# Patient Record
Sex: Male | Born: 1990 | Race: White | Hispanic: No | Marital: Married | Smoking: Never smoker
Health system: Southern US, Community
[De-identification: ages and names within clinical notes are randomized; demographics above are authoritative.]

## PROBLEM LIST (undated history)

## (undated) HISTORY — PX: NO PAST SURGERIES: SHX2092

---
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
## 2009-05-18 IMAGING — CT CT NECK W/ CM
2 of 3 series · 13 of 20 positions shown, 16 images · IV contrast (agent unspecified)
Comparison: None

CLINICAL DATA: Headache and fever congestion with wisdom teeth
removed on 11/11/2007

CT NECK WITH CONTRAST
TECHNIQUE: Multidetector CT imaging of the neck was performed with
intravenous contrast.
Contrast: 100 ml 1mnipaque-OUU IV

[Series 3: 2cc/(id)/1cc/(id) · axial · 0.42mm/px · z∈[-222,-0]mm · 12 of 71 slices shown, 15 images]
[im 6/71  soft-tissue]
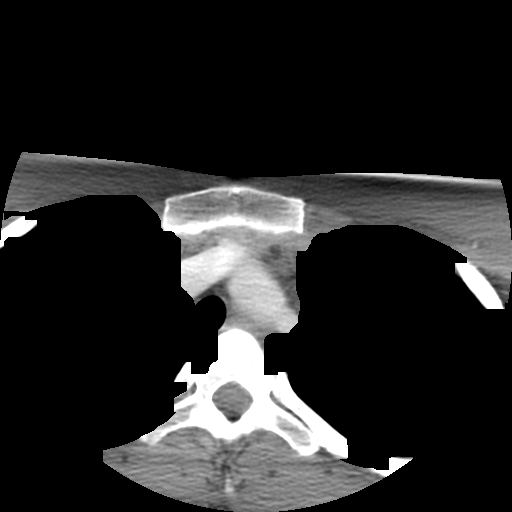
[im 6/71  bone]
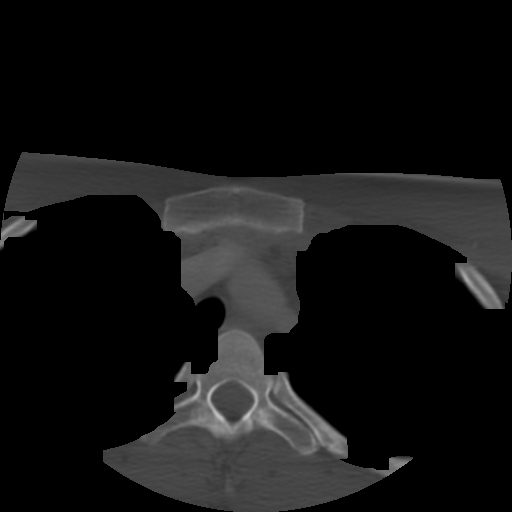
[im 11/71  bone]
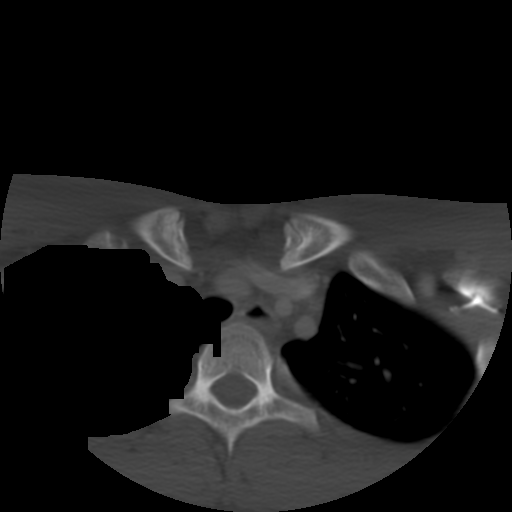
[im 17/71  bone]
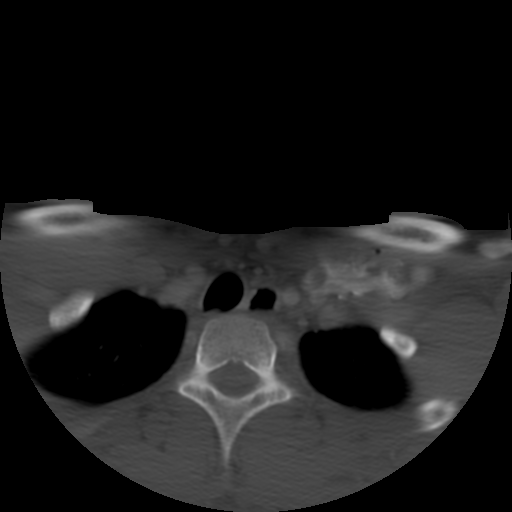
[im 22/71  bone]
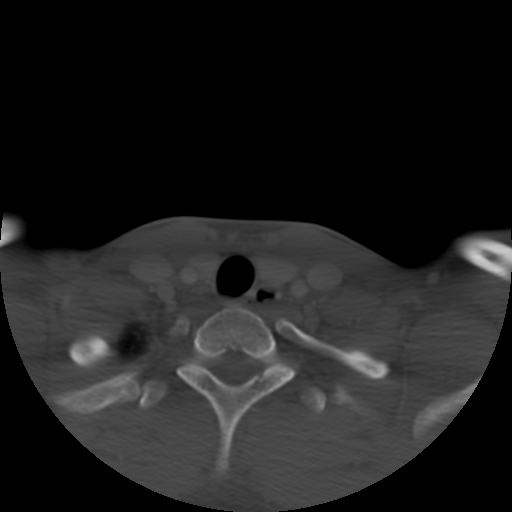
[im 27/71  soft-tissue]
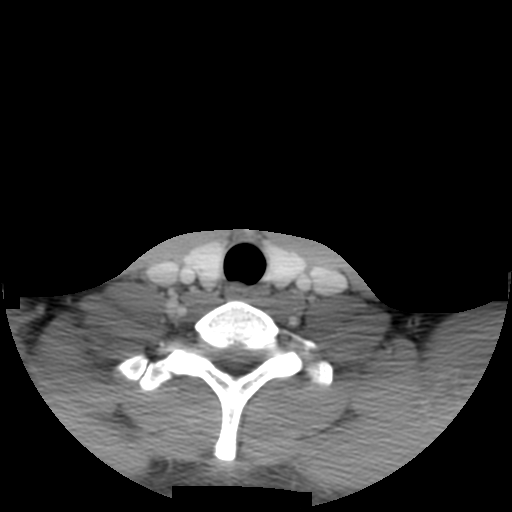
[im 27/71  bone]
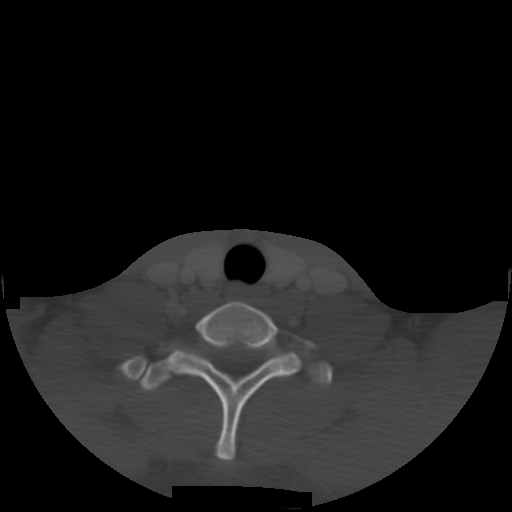
[im 33/71  bone]
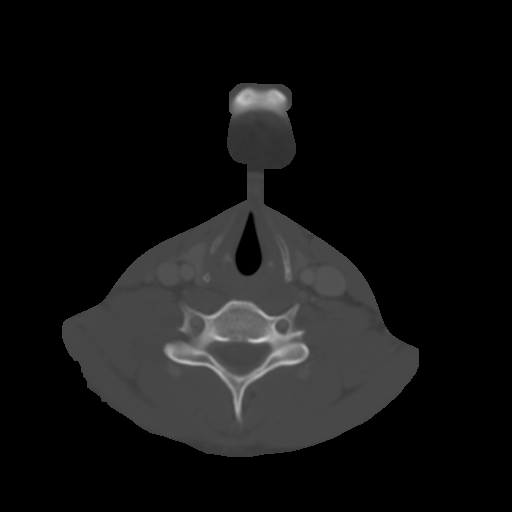
[im 38/71  bone]
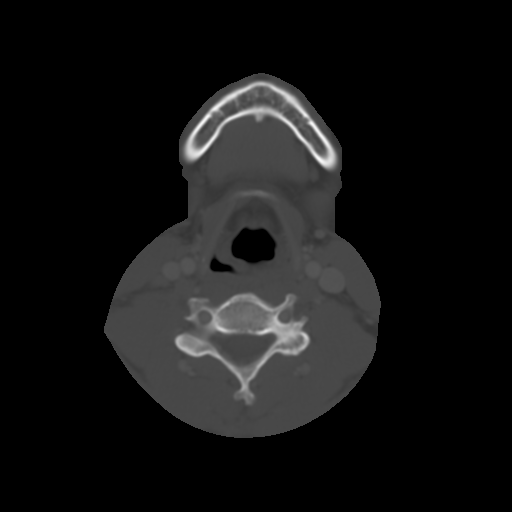
[im 44/71  bone]
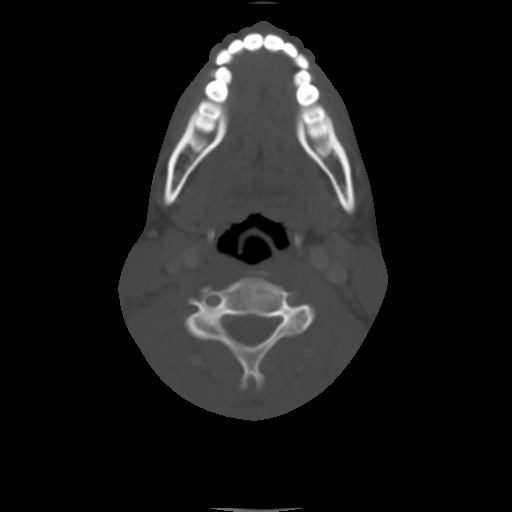
[im 49/71  soft-tissue]
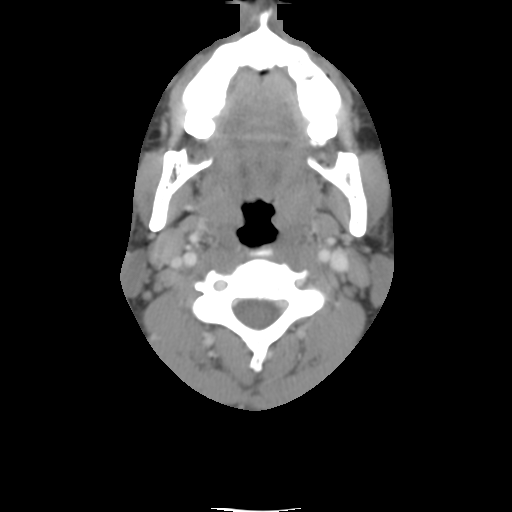
[im 49/71  bone]
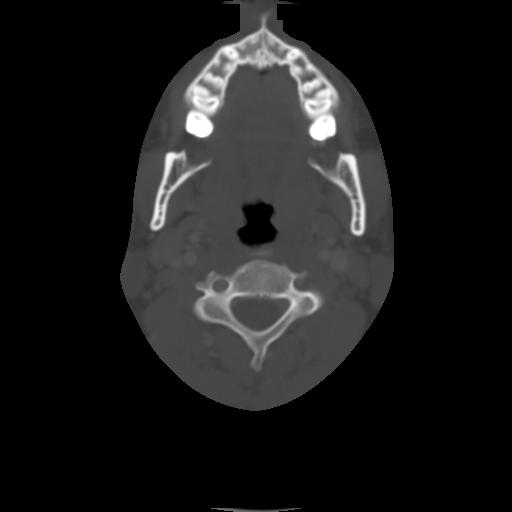
[im 54/71  bone]
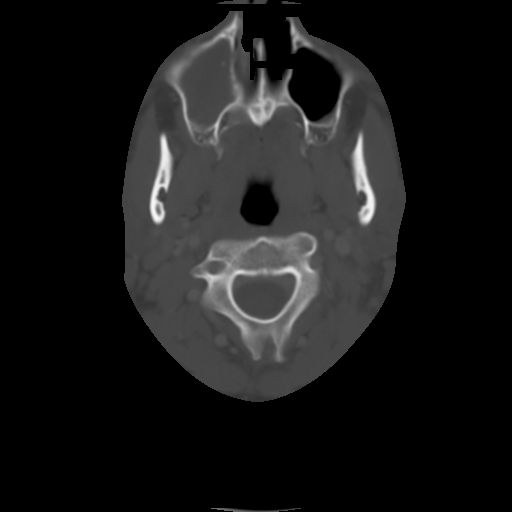
[im 60/71  bone]
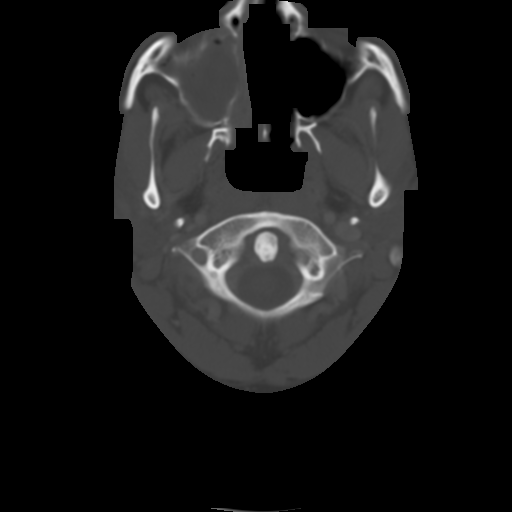
[im 65/71  bone]
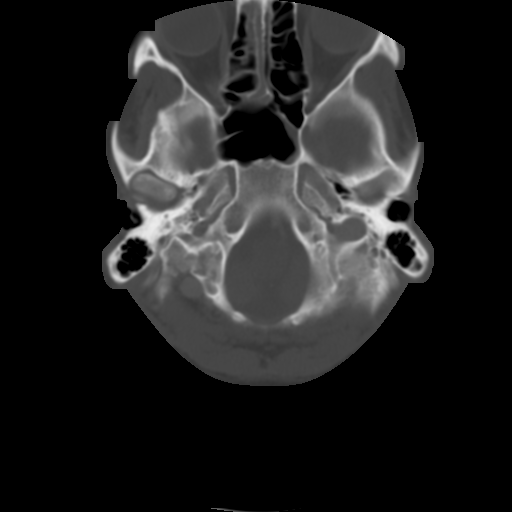

[Series 400: sag · sagittal · 0.53mm/px · 1 of 79 slices shown]
[im 40/79  bone]
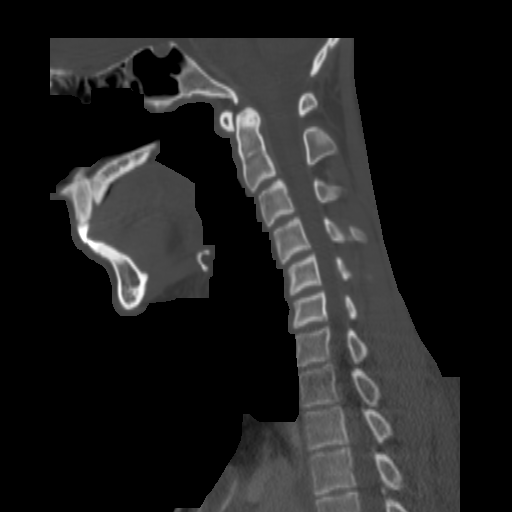

[13 of 20 positions shown; findings below may reference images not displayed]

FINDINGS: There is opacification the right maxillary sinus
containing gas bubbles.  This is probably due to acute sinusitis.
Nasal septum is deviated to the right  and there is mucosal
thickening in the right ethmoid sinus.

There is cervical adenopathy.  There is an enlarged level II node
on the right measuring 12 x 17mm.  There is a 10 x15 mm node on the
left and a 12 mm node on the left.  These nodes are homogeneous and
show mild enhancement.  No other pathologically enlarged nodes are
identified.  The thyroid is normal, the submandibular and parotid
glands are normal bilaterally.  Mild soft tissue thickening involve
lymphoid tissue  in the pharynx.
IMPRESSION: Right-sided sinusitis which may be acute.

Cervical adenopathy is present with enlarged  level II nodes
bilaterally.  These are likely reactive nodes.  Lymphoma is in the
differential and close follow-up to assure clearing of these nodes
is suggested.

## 2011-03-24 LAB — BASIC METABOLIC PANEL
CO2: 26
Chloride: 101
Glucose, Bld: 81
Sodium: 137

## 2011-03-24 LAB — DIFFERENTIAL
Basophils Absolute: 0
Basophils Relative: 0
Eosinophils Absolute: 0.1
Eosinophils Relative: 1
Monocytes Absolute: 1.4 — ABNORMAL HIGH
Monocytes Relative: 13 — ABNORMAL HIGH

## 2011-03-24 LAB — CBC
Hemoglobin: 14.9
MCHC: 34
MCV: 88.1
RBC: 4.97
RDW: 13.1

## 2011-03-24 LAB — CULTURE, BLOOD (ROUTINE X 2): Culture: NO GROWTH

## 2013-10-23 ENCOUNTER — Emergency Department (HOSPITAL_COMMUNITY)
Admission: EM | Admit: 2013-10-23 | Discharge: 2013-10-23 | Disposition: A | Discharge status: Home / Self Care | Payer: 59 | Attending: Emergency Medicine | Admitting: Emergency Medicine

## 2013-10-23 ENCOUNTER — Encounter (HOSPITAL_COMMUNITY): Payer: Self-pay | Admitting: Emergency Medicine

## 2013-10-23 ENCOUNTER — Emergency Department (HOSPITAL_COMMUNITY): Payer: 59

## 2013-10-23 DIAGNOSIS — Z79899 Other long term (current) drug therapy: Secondary | ICD-10-CM | POA: Insufficient documentation

## 2013-10-23 DIAGNOSIS — N453 Epididymo-orchitis: Secondary | ICD-10-CM | POA: Insufficient documentation

## 2013-10-23 DIAGNOSIS — N451 Epididymitis: Secondary | ICD-10-CM

## 2013-10-23 LAB — URINALYSIS, ROUTINE W REFLEX MICROSCOPIC
Bilirubin Urine: NEGATIVE
GLUCOSE, UA: NEGATIVE mg/dL
HGB URINE DIPSTICK: NEGATIVE
Ketones, ur: NEGATIVE mg/dL
LEUKOCYTES UA: NEGATIVE
Nitrite: NEGATIVE
Protein, ur: NEGATIVE mg/dL
SPECIFIC GRAVITY, URINE: 1.029 (ref 1.005–1.030)
UROBILINOGEN UA: 0.2 mg/dL (ref 0.0–1.0)
pH: 6.5 (ref 5.0–8.0)

## 2013-10-23 MED ORDER — DOXYCYCLINE HYCLATE 100 MG PO CAPS: 100.0000 mg | ORAL_CAPSULE | Freq: Two times a day (BID) | ORAL | Status: DC

## 2013-10-23 MED ORDER — NAPROXEN 500 MG PO TABS: 500.0000 mg | ORAL_TABLET | Freq: Two times a day (BID) | ORAL | Status: DC

## 2013-10-23 MED ORDER — IBUPROFEN 800 MG PO TABS
800.0000 mg | ORAL_TABLET | Freq: Once | ORAL | Status: AC
  Administered 2013-10-23: 800 mg via ORAL
  Filled 2013-10-23: qty 1

## 2013-10-23 MED ORDER — CEFTRIAXONE SODIUM 250 MG IJ SOLR
250.0000 mg | Freq: Once | INTRAMUSCULAR | Status: AC
  Administered 2013-10-23: 250 mg via INTRAMUSCULAR
  Filled 2013-10-23: qty 250

## 2013-10-23 MED ORDER — LIDOCAINE HCL (PF) 1 % IJ SOLN
INTRAMUSCULAR | Status: AC
  Administered 2013-10-23: 2 mL
  Filled 2013-10-23: qty 5

## 2013-10-23 NOTE — Discharge Instructions (Signed)
Start taking naprosyn daily for inflammation. Take antibiotic until all gone. Follow up with urology. Return if worsening.     Epididymitis Epididymitis is a swelling (inflammation) of the epididymis. The epididymis is a cord-like structure along the back part of the testicle. Epididymitis is usually, but not always, caused by infection. This is usually a sudden problem beginning with chills, fever and pain behind the scrotum and in the testicle. There may be swelling and redness of the testicle. DIAGNOSIS  Physical examination will reveal a tender, swollen epididymis. Sometimes, cultures are obtained from the urine or from prostate secretions to help find out if there is an infection or if the cause is a different problem. Sometimes, blood work is performed to see if your white blood cell count is elevated and if a germ (bacterial) or viral infection is present. Using this knowledge, an appropriate medicine which kills germs (antibiotic) can be chosen by your caregiver. A viral infection causing epididymitis will most often go away (resolve) without treatment. HOME CARE INSTRUCTIONS   Hot sitz baths for 20 minutes, 4 times per day, may help relieve pain.  Only take over-the-counter or prescription medicines for pain, discomfort or fever as directed by your caregiver.  Take all medicines, including antibiotics, as directed. Take the antibiotics for the full prescribed length of time even if you are feeling better.  It is very important to keep all follow-up appointments. SEEK IMMEDIATE MEDICAL CARE IF:   You have a fever.  You have pain not relieved with medicines.  You have any worsening of your problems.  Your pain seems to come and go.  You develop pain, redness, and swelling in the scrotum and surrounding areas. MAKE SURE YOU:   Understand these instructions.  Will watch your condition.  Will get help right away if you are not doing well or get worse. Document Released:  06/11/2000 Document Revised: 09/06/2011 Document Reviewed: 05/01/2009 [facility] Patient Information 2014 [place], [place].

## 2013-10-23 NOTE — ED Notes (Signed)
Presents with c/o left testicular pain that began yesterday.  Has been treated for epididmitis two weeks ago, same side.  Denies injury.  Admits to some swelling. Exam deferred at this time.

## 2013-10-23 NOTE — ED Notes (Signed)
Pt still in US

## 2013-10-23 NOTE — ED Provider Notes (Signed)
CSN: [id number]     Arrival date & time 10/23/13  0610 History   First MD Initiated Contact with Patient 10/23/13 [phone]     Chief Complaint  Patient presents with  . Testicle Pain     (Consider location/radiation/quality/duration/timing/severity/associated sxs/prior Treatment) HPI Patrick Ford is a 23 y.o. male who presents to emergency department complaining of left testicular pain and swelling. Patient states pain began gradually onset yesterday morning. States worsened this morning. Patient recently was seen by his primary care Dr. for similar symptoms a week ago, did not have any tests done at that time, was diagnosed with epididymitis and started on antibiotics which she finished. Patient states his pain did improve with antibiotics. He denies any recent injury to that area. He denies any pain with urination. No hematuria. No penile discharge. He is sexually active with one partner. He denies any fever, chills, abdominal pain. No changes in his bowels. States was recently checked for hernias and was told he didn't have any. He denies any nausea or vomiting.    History reviewed. No pertinent past medical history. History reviewed. No pertinent past surgical history. History reviewed. No pertinent family history. History  Substance Use Topics  . Smoking status: Never Smoker   . Smokeless tobacco: Not on file  . Alcohol Use: Yes     Comment: socially    Review of Systems  Constitutional: Negative for fever and chills.  Respiratory: Negative for cough, chest tightness and shortness of breath.   Cardiovascular: Negative for chest pain, palpitations and leg swelling.  Gastrointestinal: Negative for nausea, vomiting, abdominal pain, diarrhea and abdominal distention.  Genitourinary: Positive for scrotal swelling and testicular pain. Negative for dysuria, urgency, frequency, hematuria, discharge, penile swelling and penile pain.  Musculoskeletal: Negative for arthralgias, myalgias,  neck pain and neck stiffness.  Skin: Negative for rash.  Allergic/Immunologic: Negative for immunocompromised state.  Neurological: Negative for dizziness, weakness, light-headedness, numbness and headaches.      Allergies  Review of patient's allergies indicates no known allergies.  Home Medications   Prior to Admission medications   Medication Sig Start Date End Date Taking? Authorizing Provider  amphetamine-dextroamphetamine (ADDERALL) 10 MG tablet Take 10 mg by mouth 2 (two) times daily with a meal.   Yes Historical Provider, MD  HYDROcodone-acetaminophen (NORCO/VICODIN) 5-325 MG per tablet Take 1 tablet by mouth every 6 (six) hours as needed for moderate pain.   Yes Historical Provider, MD   BP 130/72  Pulse 65  Temp(Src) 97.9 F (36.6 C) (Oral)  Resp 16  Ht 6\' 5"  (1.956 m)  Wt 185 lb (83.915 kg)  BMI 21.93 kg/m2  SpO2 99% Physical Exam  Nursing note and vitals reviewed. Constitutional: He is oriented to person, place, and time. He appears well-developed and well-nourished. No distress.  HENT:  Head: Normocephalic and atraumatic.  Eyes: Conjunctivae are normal.  Neck: Neck supple.  Cardiovascular: Normal rate, regular rhythm and normal heart sounds.   Pulmonary/Chest: Effort normal. No respiratory distress. He has no wheezes. He has no rales.  Abdominal: Soft. Bowel sounds are normal. He exhibits no distension. There is no tenderness. There is no rebound.  Genitourinary:  Left testicle is swollen, tender to palpation. Cremasteric reflex present bilaterally. Penis is normal, uncircumcised.  Musculoskeletal: He exhibits no edema.  Neurological: He is alert and oriented to person, place, and time.  Skin: Skin is warm and dry.    ED Course  Procedures (including critical care time) Labs Review Labs Reviewed  URINE CULTURE  GC/CHLAMYDIA PROBE AMP  URINALYSIS, ROUTINE W REFLEX MICROSCOPIC    Imaging Review [place] Scrotum  10/23/2013   CLINICAL DATA:  Left scrotal  pain and swelling.  EXAM: SCROTAL ULTRASOUND  DOPPLER ULTRASOUND OF THE TESTICLES  TECHNIQUE: Complete ultrasound examination of the testicles, epididymis, and other scrotal structures was performed. Color and spectral Doppler ultrasound were also utilized to evaluate blood flow to the testicles.  COMPARISON:  None.  FINDINGS: Right testicle  Measurements: 2.6 x 1.7 x 2.0 cm. No mass or microlithiasis visualized.  Left testicle  Measurements: 5.4 x 4.0 x 4.1 cm. No mass or microlithiasis visualized.  Right epididymis:  Normal in size and appearance.  Left epididymis: Enlarged with increased vascularity and an inhomogeneous appearance with an adjacent small complex hydrocele.  Hydrocele:  Small left hydrocele.  Varicocele:  Small left varicocele.  Pulsed Doppler interrogation of both testes demonstrates low resistance arterial and venous waveforms bilaterally.  IMPRESSION: Left epididymitis.  Small left varicocele.   Electronically Signed   By: Geanie CooleyJim  Maxwell M.D.   On: 10/23/2013 08:12   [place] Art/ven Flow Abd Pelv Doppler  10/23/2013   CLINICAL DATA:  Left scrotal pain and swelling.  EXAM: SCROTAL ULTRASOUND  DOPPLER ULTRASOUND OF THE TESTICLES  TECHNIQUE: Complete ultrasound examination of the testicles, epididymis, and other scrotal structures was performed. Color and spectral Doppler ultrasound were also utilized to evaluate blood flow to the testicles.  COMPARISON:  None.  FINDINGS: Right testicle  Measurements: 2.6 x 1.7 x 2.0 cm. No mass or microlithiasis visualized.  Left testicle  Measurements: 5.4 x 4.0 x 4.1 cm. No mass or microlithiasis visualized.  Right epididymis:  Normal in size and appearance.  Left epididymis: Enlarged with increased vascularity and an inhomogeneous appearance with an adjacent small complex hydrocele.  Hydrocele:  Small left hydrocele.  Varicocele:  Small left varicocele.  Pulsed Doppler interrogation of both testes demonstrates low resistance arterial and venous waveforms  bilaterally.  IMPRESSION: Left epididymitis.  Small left varicocele.   Electronically Signed   By: Geanie CooleyJim  Maxwell M.D.   On: 10/23/2013 08:12     EKG Interpretation None      MDM   Final diagnoses:  Epididymitis, left   Patient with gradual onset of left testicular pain and swelling. Recent epididymitis. No urinary symptoms. Will get UA, ultrasound of scrotum.  8:52 AM Ultrasounds positive for left epididymitis. Given 250 mg of Rocephin IM in ED. Ibuprofen 800 mg. Home with doxycycline, followup with urology for recurrent epididymitis. Return if symptoms worsening. GC/ Chlamydia and urine culture sent.  Filed Vitals:   10/23/13 0634  BP: 130/72  Pulse: 65  Temp: 97.9 F (36.6 C)  TempSrc: Oral  Resp: 16  Height: 6\' 5"  (1.956 m)  Weight: 185 lb (83.915 kg)  SpO2: 99%      Lottie Musselatyana A Aviela Blundell, PA-C 10/23/13 [phone]

## 2013-10-23 NOTE — ED Notes (Signed)
Pt to US.

## 2013-10-24 LAB — URINE CULTURE
Colony Count: NO GROWTH
Culture: NO GROWTH

## 2013-10-24 LAB — GC/CHLAMYDIA PROBE AMP
CT PROBE, AMP APTIMA: NEGATIVE
GC PROBE AMP APTIMA: NEGATIVE

## 2013-10-24 NOTE — ED Provider Notes (Signed)
Medical screening examination/treatment/procedure(s) were performed by non-physician practitioner and as supervising physician I was immediately available for consultation/collaboration.   EKG Interpretation None        Majour Frei, MD 10/24/13 0357 

## 2014-11-06 ENCOUNTER — Other Ambulatory Visit: Payer: Self-pay | Admitting: Family Medicine

## 2014-11-06 DIAGNOSIS — G4452 New daily persistent headache (NDPH): Secondary | ICD-10-CM

## 2014-11-07 ENCOUNTER — Ambulatory Visit: Admission: RE | Admit: 2014-11-07 | Payer: 59 | Source: Ambulatory Visit

## 2014-12-09 ENCOUNTER — Other Ambulatory Visit: Payer: Self-pay | Admitting: *Deleted

## 2014-12-09 MED ORDER — AMPHETAMINE-DEXTROAMPHETAMINE 10 MG PO TABS: 10.0000 mg | ORAL_TABLET | Freq: Two times a day (BID) | ORAL | Status: DC

## 2014-12-09 NOTE — Telephone Encounter (Signed)
Refill request for Adderall 10 mg Last filled by MD on- 11/05/2014 #60 x0 Last Appt: 11/05/2014 Next Appt: none Please advise refill?

## 2015-01-08 ENCOUNTER — Other Ambulatory Visit: Payer: Self-pay | Admitting: *Deleted

## 2015-01-08 MED ORDER — AMPHETAMINE-DEXTROAMPHETAMINE 10 MG PO TABS: 10.0000 mg | ORAL_TABLET | Freq: Two times a day (BID) | ORAL | Status: DC

## 2015-02-11 ENCOUNTER — Other Ambulatory Visit: Payer: Self-pay | Admitting: *Deleted

## 2015-02-11 MED ORDER — AMPHETAMINE-DEXTROAMPHETAMINE 10 MG PO TABS: 10.0000 mg | ORAL_TABLET | Freq: Two times a day (BID) | ORAL | Status: DC

## 2015-03-13 ENCOUNTER — Other Ambulatory Visit: Payer: Self-pay | Admitting: *Deleted

## 2015-03-14 MED ORDER — AMPHETAMINE-DEXTROAMPHETAMINE 10 MG PO TABS: 10.0000 mg | ORAL_TABLET | Freq: Two times a day (BID) | ORAL | Status: DC

## 2015-04-24 ENCOUNTER — Other Ambulatory Visit: Payer: Self-pay | Admitting: *Deleted

## 2015-04-25 MED ORDER — AMPHETAMINE-DEXTROAMPHETAMINE 10 MG PO TABS: 10.0000 mg | ORAL_TABLET | Freq: Two times a day (BID) | ORAL | Status: DC

## 2015-04-26 IMAGING — US US SCROTUM
1 series · 14 of 25 positions shown · non-contrast
Comparison: None.

CLINICAL DATA: Left scrotal pain and swelling.

EXAM:
SCROTAL ULTRASOUND
DOPPLER ULTRASOUND OF THE TESTICLES
TECHNIQUE: Complete ultrasound examination of the testicles, epididymis, and
other scrotal structures was performed. Color and spectral Doppler
ultrasound were also utilized to evaluate blood flow to the
testicles.

[Series 1: us scrotum · 0.06mm/px · 14 of 71 slices shown]
[im 1/71]
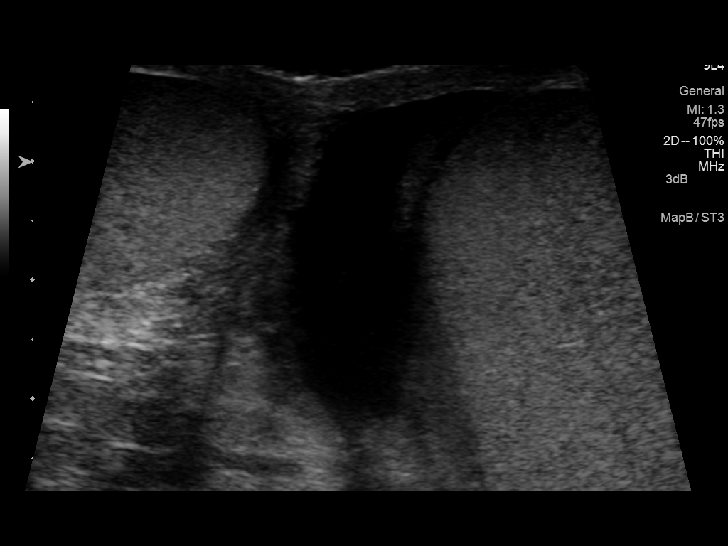
[im 6/71]
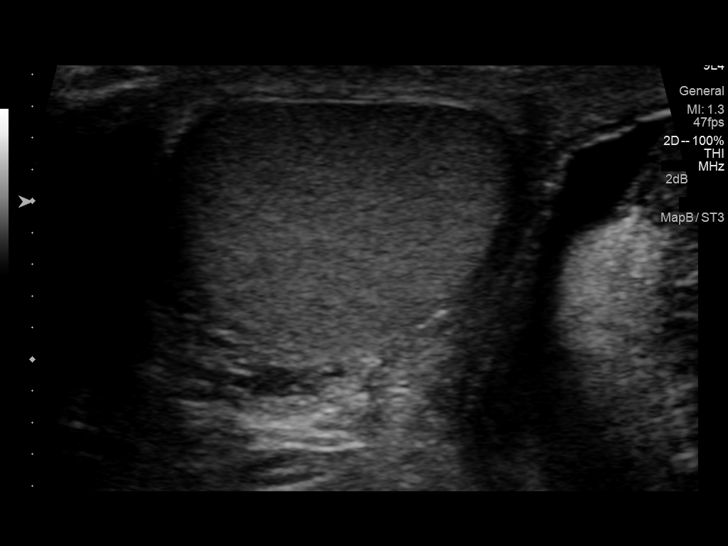
[im 12/71]
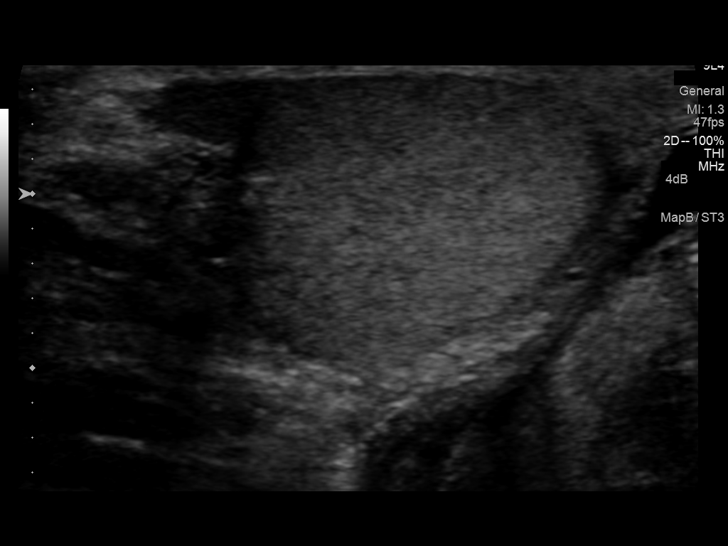
[im 18/71]
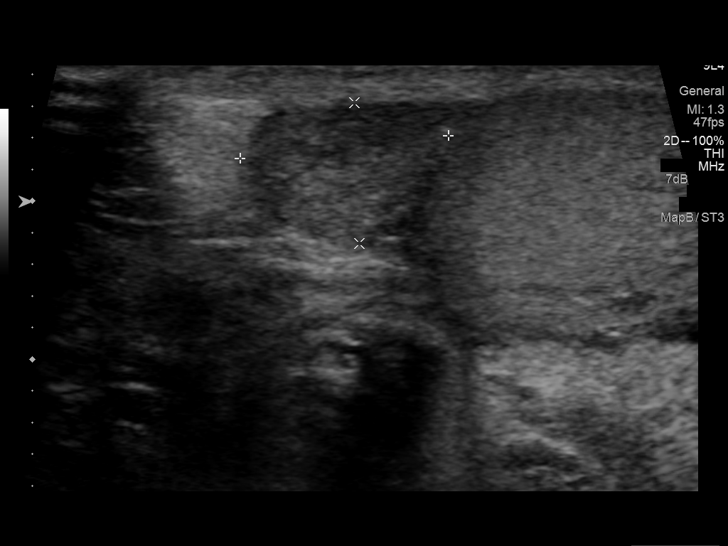
[im 24/71]
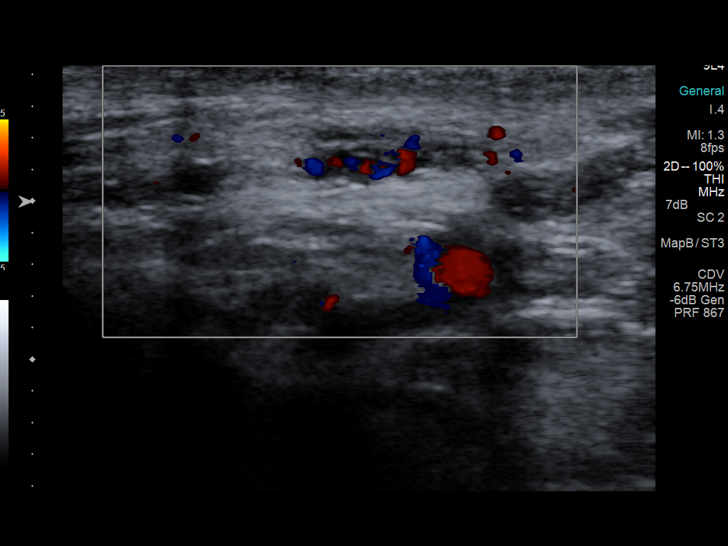
[im 27/71]
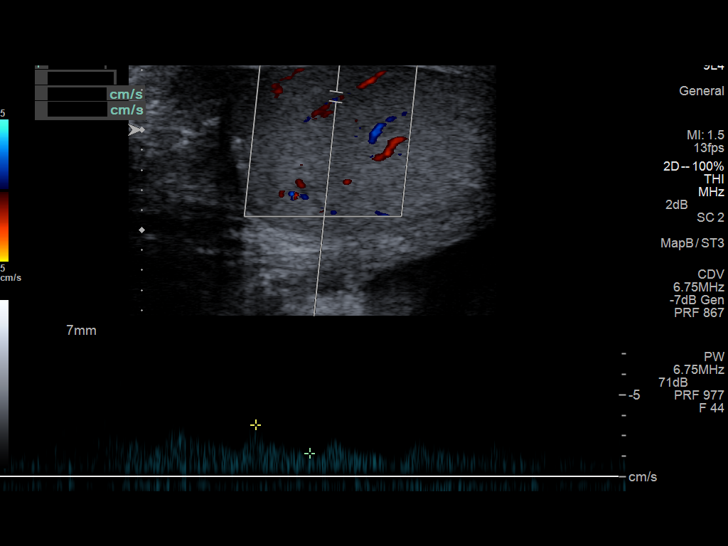
[im 33/71]
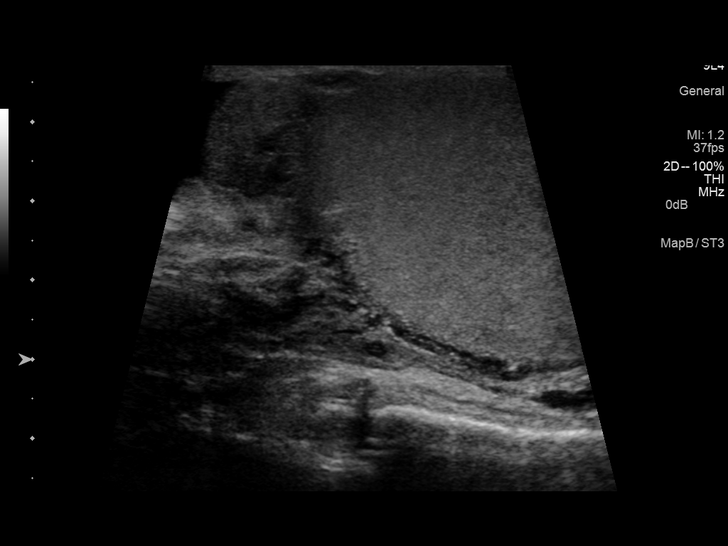
[im 38/71]
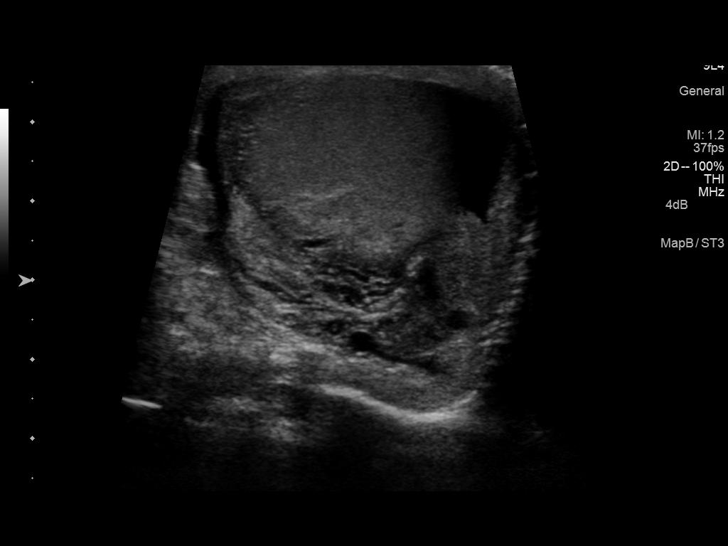
[im 44/71]
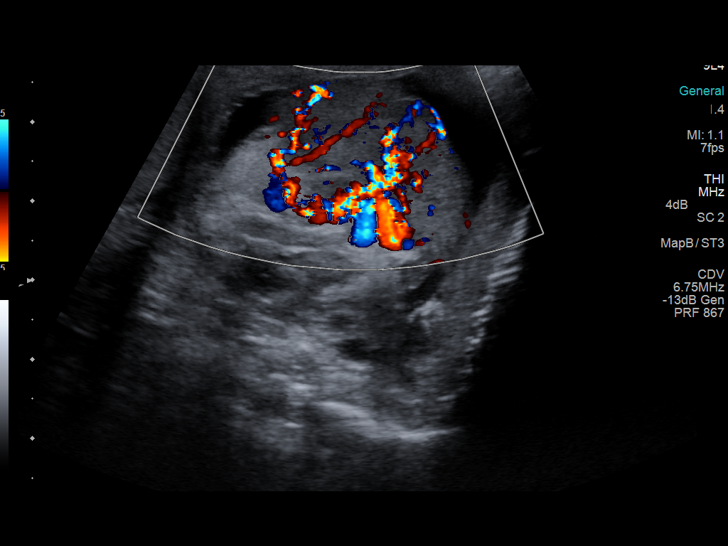
[im 47/71]
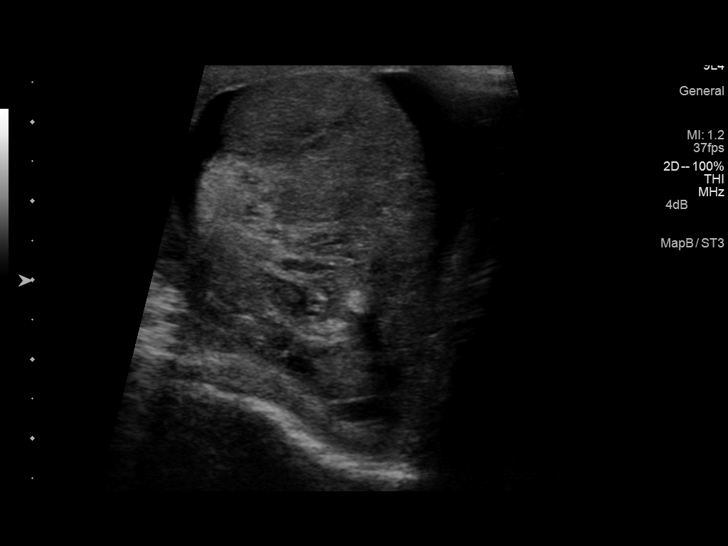
[im 53/71]
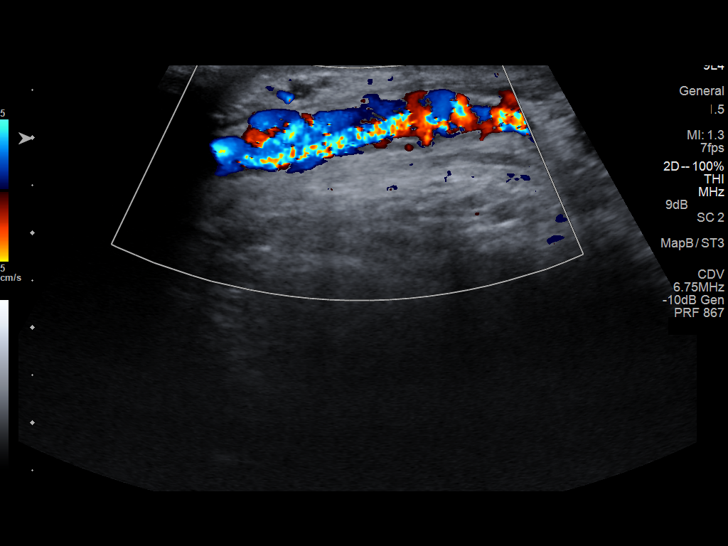
[im 59/71]
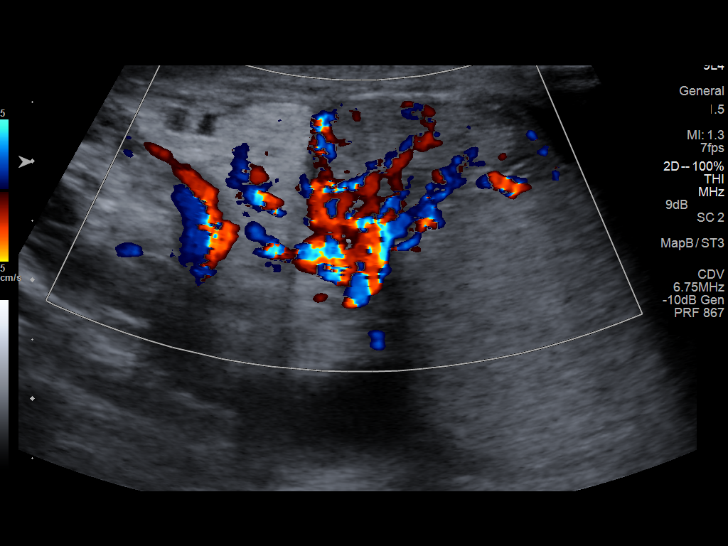
[im 65/71]
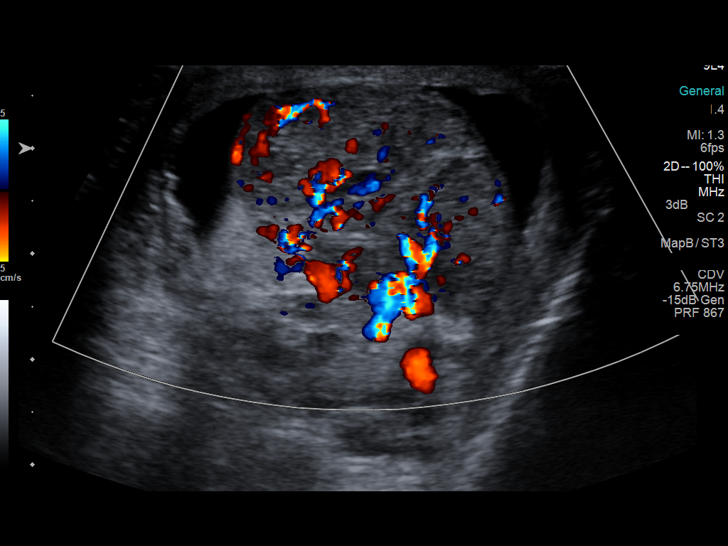
[im 71/71]
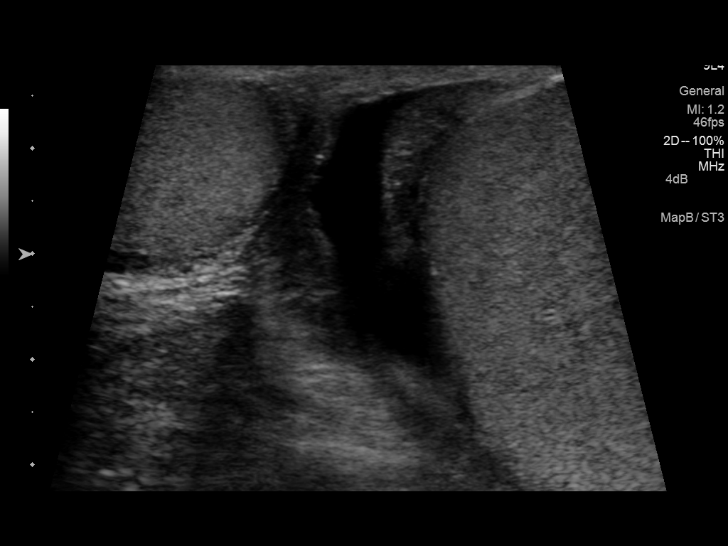

[14 of 25 positions shown; findings below may reference images not displayed]

FINDINGS: Right testicle

Measurements: 2.6 x 1.7 x 2.0 cm. No mass or microlithiasis
visualized.

Left testicle

Measurements: 5.4 x 4.0 x 4.1 cm. No mass or microlithiasis
visualized.

Right epididymis:  Normal in size and appearance.

Left epididymis: Enlarged with increased vascularity and an
inhomogeneous appearance with an adjacent small complex hydrocele.

Hydrocele:  Small left hydrocele.

Varicocele:  Small left varicocele.

Pulsed Doppler interrogation of both testes demonstrates low
resistance arterial and venous waveforms bilaterally.
IMPRESSION: Left epididymitis.  Small left varicocele.

## 2015-06-03 ENCOUNTER — Other Ambulatory Visit: Payer: Self-pay | Admitting: *Deleted

## 2015-06-03 MED ORDER — AMPHETAMINE-DEXTROAMPHETAMINE 10 MG PO TABS: 10.0000 mg | ORAL_TABLET | Freq: Two times a day (BID) | ORAL | Status: DC

## 2015-07-11 ENCOUNTER — Telehealth: Payer: Self-pay

## 2015-07-11 MED ORDER — AMPHETAMINE-DEXTROAMPHETAMINE 10 MG PO TABS: 10.0000 mg | ORAL_TABLET | Freq: Two times a day (BID) | ORAL | Status: DC

## 2015-07-11 NOTE — Telephone Encounter (Signed)
Refill request for Adderall from patient, last filled was 06/03/15. Please review.-aa

## 2015-08-05 ENCOUNTER — Other Ambulatory Visit: Payer: Self-pay | Admitting: *Deleted

## 2015-08-05 MED ORDER — AMPHETAMINE-DEXTROAMPHETAMINE 10 MG PO TABS: 10.0000 mg | ORAL_TABLET | Freq: Two times a day (BID) | ORAL | Status: DC

## 2015-09-11 ENCOUNTER — Other Ambulatory Visit: Payer: Self-pay | Admitting: *Deleted

## 2015-09-12 MED ORDER — AMPHETAMINE-DEXTROAMPHETAMINE 10 MG PO TABS: 10.0000 mg | ORAL_TABLET | Freq: Two times a day (BID) | ORAL | Status: DC

## 2015-10-31 ENCOUNTER — Other Ambulatory Visit: Payer: Self-pay | Admitting: *Deleted

## 2015-10-31 MED ORDER — AMPHETAMINE-DEXTROAMPHETAMINE 10 MG PO TABS: 10.0000 mg | ORAL_TABLET | Freq: Two times a day (BID) | ORAL | Status: DC

## 2015-12-17 ENCOUNTER — Other Ambulatory Visit: Payer: Self-pay | Admitting: *Deleted

## 2015-12-17 MED ORDER — AMPHETAMINE-DEXTROAMPHETAMINE 10 MG PO TABS: 10.0000 mg | ORAL_TABLET | Freq: Two times a day (BID) | ORAL | Status: DC

## 2016-01-20 ENCOUNTER — Other Ambulatory Visit: Payer: Self-pay

## 2016-01-20 MED ORDER — AMPHETAMINE-DEXTROAMPHETAMINE 10 MG PO TABS: 10.0000 mg | ORAL_TABLET | Freq: Two times a day (BID) | ORAL | 0 refills | Status: DC

## 2016-01-20 NOTE — Telephone Encounter (Signed)
Patient is requesting refill on medication. Thanks!  

## 2016-03-03 ENCOUNTER — Other Ambulatory Visit: Payer: Self-pay | Admitting: *Deleted

## 2016-03-03 MED ORDER — AMPHETAMINE-DEXTROAMPHETAMINE 10 MG PO TABS: 10.0000 mg | ORAL_TABLET | Freq: Two times a day (BID) | ORAL | 0 refills | Status: DC

## 2016-04-13 ENCOUNTER — Other Ambulatory Visit: Payer: Self-pay | Admitting: *Deleted

## 2016-04-13 MED ORDER — AMPHETAMINE-DEXTROAMPHETAMINE 10 MG PO TABS: 10.0000 mg | ORAL_TABLET | Freq: Two times a day (BID) | ORAL | 0 refills | Status: DC

## 2016-05-14 ENCOUNTER — Other Ambulatory Visit: Payer: Self-pay

## 2016-05-14 MED ORDER — AMPHETAMINE-DEXTROAMPHETAMINE 10 MG PO TABS: 10.0000 mg | ORAL_TABLET | Freq: Two times a day (BID) | ORAL | 0 refills | Status: DC

## 2016-05-14 NOTE — Telephone Encounter (Signed)
Patient requesting refills

## 2016-06-24 ENCOUNTER — Other Ambulatory Visit: Payer: Self-pay | Admitting: *Deleted

## 2016-06-24 MED ORDER — AMPHETAMINE-DEXTROAMPHETAMINE 10 MG PO TABS: 10.0000 mg | ORAL_TABLET | Freq: Two times a day (BID) | ORAL | 0 refills | Status: DC

## 2016-07-27 ENCOUNTER — Other Ambulatory Visit: Payer: Self-pay | Admitting: *Deleted

## 2016-07-27 MED ORDER — NAPROXEN 500 MG PO TABS: 500.0000 mg | ORAL_TABLET | Freq: Two times a day (BID) | ORAL | 3 refills | Status: DC

## 2016-07-27 MED ORDER — AMPHETAMINE-DEXTROAMPHETAMINE 10 MG PO TABS: 10.0000 mg | ORAL_TABLET | Freq: Two times a day (BID) | ORAL | 0 refills | Status: DC

## 2016-07-27 NOTE — Addendum Note (Signed)
Addended by: Marlene LardMILLER, Daeshon Grammatico M on: 07/27/2016 10:21 AM   Modules accepted: Orders

## 2016-09-13 ENCOUNTER — Other Ambulatory Visit: Payer: Self-pay

## 2016-09-13 NOTE — Telephone Encounter (Signed)
Patient sent email requesting refills. Thanks!  

## 2016-09-14 MED ORDER — AMPHETAMINE-DEXTROAMPHETAMINE 10 MG PO TABS: 10.0000 mg | ORAL_TABLET | Freq: Two times a day (BID) | ORAL | 0 refills | Status: DC

## 2016-10-12 ENCOUNTER — Encounter: Payer: Self-pay | Admitting: Family Medicine

## 2016-10-12 ENCOUNTER — Ambulatory Visit (INDEPENDENT_AMBULATORY_CARE_PROVIDER_SITE_OTHER): Payer: 59 | Admitting: Family Medicine

## 2016-10-12 VITALS — BP 128/80 | HR 96 | Temp 99.3°F | Resp 16 | Ht 77.0 in | Wt 187.0 lb

## 2016-10-12 DIAGNOSIS — F988 Other specified behavioral and emotional disorders with onset usually occurring in childhood and adolescence: Secondary | ICD-10-CM | POA: Diagnosis not present

## 2016-10-12 DIAGNOSIS — Z0000 Encounter for general adult medical examination without abnormal findings: Secondary | ICD-10-CM

## 2016-10-12 MED ORDER — AMPHETAMINE-DEXTROAMPHETAMINE 15 MG PO TABS: 15.0000 mg | ORAL_TABLET | Freq: Two times a day (BID) | ORAL | 0 refills | Status: DC

## 2016-10-12 NOTE — Patient Instructions (Addendum)
Please bring copy of your most blood work for your  Preventive Care 18-39 Years, Male Preventive care refers to lifestyle choices and visits with your health care provider that can promote health and wellness. What does preventive care include? A yearly physical exam. This is also called an annual well check. Dental exams once or twice a year. Routine eye exams. Ask your health care provider how often you should have your eyes checked. Personal lifestyle choices, including: Daily care of your teeth and gums. Regular physical activity. Eating a healthy diet. Avoiding tobacco and drug use. Limiting alcohol use. Practicing safe sex. What happens during an annual well check? The services and screenings done by your health care provider during your annual well check will depend on your age, overall health, lifestyle risk factors, and family history of disease. Counseling  Your health care provider may ask you questions about your: Alcohol use. Tobacco use. Drug use. Emotional well-being. Home and relationship well-being. Sexual activity. Eating habits. Work and work Statistician. Screening  You may have the following tests or measurements: Height, weight, and BMI. Blood pressure. Lipid and cholesterol levels. These may be checked every 5 years starting at age 15. Diabetes screening. This is done by checking your blood sugar (glucose) after you have not eaten for a while (fasting). Skin check. Hepatitis C blood test. Hepatitis B blood test. Sexually transmitted disease (STD) testing. Discuss your test results, treatment options, and if necessary, the need for more tests with your health care provider. Vaccines  Your health care provider may recommend certain vaccines, such as: Influenza vaccine. This is recommended every year. Tetanus, diphtheria, and acellular pertussis (Tdap, Td) vaccine. You may need a Td booster every 10 years. Varicella vaccine. You may need this if you have  not been vaccinated. HPV vaccine. If you are 28 or younger, you may need three doses over 6 months. Measles, mumps, and rubella (MMR) vaccine. You may need at least one dose of MMR.You may also need a second dose. Pneumococcal 13-valent conjugate (PCV13) vaccine. You may need this if you have certain conditions and have not been vaccinated. Pneumococcal polysaccharide (PPSV23) vaccine. You may need one or two doses if you smoke cigarettes or if you have certain conditions. Meningococcal vaccine. One dose is recommended if you are age 38-21 years and a first-year college student living in a residence hall, or if you have one of several medical conditions. You may also need additional booster doses. Hepatitis A vaccine. You may need this if you have certain conditions or if you travel or work in places where you may be exposed to hepatitis A. Hepatitis B vaccine. You may need this if you have certain conditions or if you travel or work in places where you may be exposed to hepatitis B. Haemophilus influenzae type b (Hib) vaccine. You may need this if you have certain risk factors. Talk to your health care provider about which screenings and vaccines you need and how often you need them. This information is not intended to replace advice given to you by your health care provider. Make sure you discuss any questions you have with your health care provider. Document Released: 08/10/2001 Document Revised: 03/03/2016 Document Reviewed: 04/15/2015 Elsevier Interactive Patient Education  2017 [place] file.

## 2016-10-12 NOTE — Progress Notes (Signed)
Patient: Patrick Ford, Male    DOB: 11-09-1990, 26 y.o.   MRN: [id number] Visit Date: 10/12/2016  Today's Provider: Mila Merry, MD   Chief Complaint  Patient presents with  . Annual Exam  . ADHD  . Knee Pain   Subjective:    Annual physical exam Patrick Ford is a 26 y.o. male who presents today for health maintenance and complete physical. He feels fairly well. He reports exercising 5 days a week. He reports he is sleeping well.    ADD, follow up: Patient was last seen over 1 year ago. No changes were made in his medications. Patient is currently taking Adderall  daily. He reports good symptom control. He states he has been having more trouble concentrated lately as he has been taking some classes for his job. He sleeps well and appetite is good, but doesn't[t feel Adderall is working as well as it used to.   Knee pain: Patient reports that he has had pain in his knees for over 6 months. He reports that the right hurts greater than his left. He reports that the pain comes and goes. He occasionally takes Aleve, and it usually helps with the pain. He denies any injuries. He reports that the pain is becoming more frequent.   Review of Systems  Constitutional: Negative.   HENT: Negative.   Eyes: Negative.   Respiratory: Negative.   Cardiovascular: Negative.   Gastrointestinal: Negative.   Endocrine: Negative.   Genitourinary: Negative.   Musculoskeletal: Positive for arthralgias.  Skin: Negative.   Allergic/Immunologic: Negative.   Neurological: Negative.   Hematological: Negative.   Psychiatric/Behavioral: Negative.     Social History      He  reports that he has never smoked. He has quit using smokeless tobacco. He reports that he drinks alcohol. He reports that he does not use drugs.       Social History   Social History  . Marital status: Single    Spouse name: N/A  . Number of children: N/A  . Years of education: N/A   Social History Main  Topics  . Smoking status: Never Smoker  . Smokeless tobacco: Former Neurosurgeon  . Alcohol use Yes     Comment: socially  . Drug use: No  . Sexual activity: Yes   Other Topics Concern  . Not on file   Social History Narrative  . No narrative on file    No past medical history on file.   Patient Active Problem List   Diagnosis Date Noted  . ADD (attention deficit disorder) 10/12/2016    No past surgical history on file.  Family History        Family Status  Relation Status  . Mother Alive  . Father Alive  . Sister Alive  . Brother Alive  . Sister Alive        His family history is not on file.     No Known Allergies   Current Outpatient Prescriptions:  .  amphetamine-dextroamphetamine (ADDERALL) 10 MG tablet, Take 1 tablet (10 mg total) by mouth 2 (two) times daily with a meal., Disp: 60 tablet, Rfl: 0 .  naproxen (NAPROSYN) 500 MG tablet, Take 1 tablet (500 mg total) by mouth 2 (two) times daily., Disp: 30 tablet, Rfl: 3   Patient Care Team: Malva Limes, MD as PCP - General (Family Medicine)      Objective:   Vitals: BP 128/80 (BP Location: Left Arm, Patient Position: Sitting,  Cuff Size: Normal)   Pulse 96   Temp 99.3 F (37.4 C)   Resp 16   Ht  (1.956 m)   Wt 187 lb (84.8 kg)   SpO2 98%   BMI 22.17 kg/m    Vitals:   10/12/16 1507  BP: 128/80  Pulse: 96  Resp: 16  Temp: 99.3 F (37.4 C)  SpO2: 98%  Weight: 187 lb (84.8 kg)  Height:  (1.956 m)     Physical Exam   General Appearance:    Alert, cooperative, no distress, appears stated age  Head:    Normocephalic, without obvious abnormality, atraumatic  Eyes:    PERRL, conjunctiva/corneas clear, EOM's intact, fundi    benign, both eyes       Ears:    Normal TM's and external ear canals, both ears  Nose:   Nares normal, septum midline, mucosa normal, no drainage   or sinus tenderness  Throat:   Lips, mucosa, and tongue normal; teeth and gums normal  Neck:   Supple, symmetrical,  trachea midline, no adenopathy;       thyroid:  No enlargement/tenderness/nodules; no carotid   bruit or JVD  Back:     Symmetric, no curvature, ROM normal, no CVA tenderness  Lungs:     Clear to auscultation bilaterally, respirations unlabored  Chest wall:    No tenderness or deformity  Heart:    Regular rate and rhythm, S1 and S2 normal, no murmur, rub   or gallop  Abdomen:     Soft, non-tender, bowel sounds active all four quadrants,    no masses, no organomegaly  Genitalia:    deferred  Rectal:    deferred  Extremities:   Extremities normal, atraumatic, no cyanosis or edema. Non-tender bony nodules over pre patellar bursae of both knees.   Pulses:   2+ and symmetric all extremities  Skin:   Skin color, texture, turgor normal, no rashes or lesions  Lymph nodes:   Cervical, supraclavicular, and axillary nodes normal  Neurologic:   CNII-XII intact. Normal strength, sensation and reflexes      throughout   Depression Screen No flowsheet data found.    Assessment & Plan:     Routine Health Maintenance and Physical Exam  Exercise Activities and Dietary recommendations Goals    None      Immunization History  Administered Date(s) Administered  . Tdap 10/29/2011    Health Maintenance  Topic Date Due  . HIV Screening  12/22/2003  . INFLUENZA VACCINE  01/26/2017  . TETANUS/TDAP  10/28/2021     Discussed health benefits of physical activity, and encouraged him to engage in regular exercise appropriate for his age and condition.     1. Annual physical exam Generally doing well. Advised to limit alcohol consumption to 1 drink on week days, and more that 5 on weekend days. He stats he had blood work done at work in January and will try to bring copy to office for his medical record.   2. Attention deficit disorder (ADD) without hyperactivity Increase Adderall from 10 to 15.  - amphetamine-dextroamphetamine (ADDERALL) 15 MG tablet; Take 1 tablet by mouth 2 (two) times  daily with a meal.  Dispense: 60 tablet; Refill: 0    Mila Merry, MD  [facility]

## 2016-11-15 ENCOUNTER — Other Ambulatory Visit: Payer: Self-pay | Admitting: *Deleted

## 2016-11-15 DIAGNOSIS — F988 Other specified behavioral and emotional disorders with onset usually occurring in childhood and adolescence: Secondary | ICD-10-CM

## 2016-11-15 MED ORDER — AMPHETAMINE-DEXTROAMPHETAMINE 15 MG PO TABS: 15.0000 mg | ORAL_TABLET | Freq: Two times a day (BID) | ORAL | 0 refills | Status: DC

## 2016-12-23 ENCOUNTER — Other Ambulatory Visit: Payer: Self-pay

## 2016-12-23 DIAGNOSIS — F988 Other specified behavioral and emotional disorders with onset usually occurring in childhood and adolescence: Secondary | ICD-10-CM

## 2016-12-23 MED ORDER — AMPHETAMINE-DEXTROAMPHETAMINE 15 MG PO TABS: 15.0000 mg | ORAL_TABLET | Freq: Two times a day (BID) | ORAL | 0 refills | Status: DC

## 2016-12-23 NOTE — Telephone Encounter (Signed)
Patient states he has requested his Adderall twice through the website.  He is flying out of town tonight and needs to have the refill. Patient states his mother Clydie BraunDebra Leather will pick up Rx when ready. Thanks

## 2016-12-23 NOTE — Telephone Encounter (Signed)
Refill request for Adderall 15 mg x2 qd Last filled by MD on- 11/15/2016 #60 x0 Last Appt: 10/12/2016 Next Appt: none Please advise refill?

## 2017-01-18 ENCOUNTER — Other Ambulatory Visit: Payer: Self-pay | Admitting: Family Medicine

## 2017-01-18 DIAGNOSIS — F988 Other specified behavioral and emotional disorders with onset usually occurring in childhood and adolescence: Secondary | ICD-10-CM

## 2017-01-18 MED ORDER — AMPHETAMINE-DEXTROAMPHETAMINE 15 MG PO TABS: 15.0000 mg | ORAL_TABLET | Freq: Two times a day (BID) | ORAL | 0 refills | Status: DC

## 2017-01-18 NOTE — Telephone Encounter (Signed)
Pt needs refill on his adderall 15mg    Pt's call [phone]  Thanks teri

## 2017-02-24 ENCOUNTER — Other Ambulatory Visit: Payer: Self-pay | Admitting: Family Medicine

## 2017-02-24 DIAGNOSIS — F988 Other specified behavioral and emotional disorders with onset usually occurring in childhood and adolescence: Secondary | ICD-10-CM

## 2017-02-24 MED ORDER — AMPHETAMINE-DEXTROAMPHETAMINE 15 MG PO TABS: 15.0000 mg | ORAL_TABLET | Freq: Two times a day (BID) | ORAL | 0 refills | Status: DC

## 2017-02-24 NOTE — Telephone Encounter (Signed)
Last OV 10/12/16 and last RF 01/18/17

## 2017-02-24 NOTE — Telephone Encounter (Signed)
Patient advised RX is ready for pick up.  

## 2017-02-24 NOTE — Telephone Encounter (Signed)
Pt needs refill on his adderall.  Thanks Fortune Brandsteri

## 2017-03-29 ENCOUNTER — Telehealth: Payer: Self-pay

## 2017-03-29 DIAGNOSIS — F988 Other specified behavioral and emotional disorders with onset usually occurring in childhood and adolescence: Secondary | ICD-10-CM

## 2017-03-29 MED ORDER — AMPHETAMINE-DEXTROAMPHETAMINE 15 MG PO TABS: 15.0000 mg | ORAL_TABLET | Freq: Two times a day (BID) | ORAL | 0 refills | Status: DC

## 2017-03-29 NOTE — Telephone Encounter (Signed)
Patient called office requesting refill on Adderall , he would like a call back on his cell when available for pick up.KW

## 2017-04-28 ENCOUNTER — Other Ambulatory Visit: Payer: Self-pay | Admitting: Family Medicine

## 2017-04-28 DIAGNOSIS — F988 Other specified behavioral and emotional disorders with onset usually occurring in childhood and adolescence: Secondary | ICD-10-CM

## 2017-04-28 MED ORDER — AMPHETAMINE-DEXTROAMPHETAMINE 15 MG PO TABS: 15.0000 mg | ORAL_TABLET | Freq: Two times a day (BID) | ORAL | 0 refills | Status: DC

## 2017-04-28 NOTE — Telephone Encounter (Signed)
Pt contacted office for refill request on the following medications:  amphetamine-dextroamphetamine (ADDERALL) 15 MG tablet  CB#[phone]

## 2017-06-02 ENCOUNTER — Telehealth: Payer: Self-pay | Admitting: Family Medicine

## 2017-06-02 DIAGNOSIS — F988 Other specified behavioral and emotional disorders with onset usually occurring in childhood and adolescence: Secondary | ICD-10-CM

## 2017-06-02 NOTE — Telephone Encounter (Signed)
Please advise 

## 2017-06-02 NOTE — Telephone Encounter (Signed)
Pt needs refill on his Adderall 15 mg  Pt wants to just pick up the hard copy here.  [facility] [place]  His call back is [phone]  Thanks teri

## 2017-06-03 MED ORDER — AMPHETAMINE-DEXTROAMPHETAMINE 15 MG PO TABS: 15.0000 mg | ORAL_TABLET | Freq: Two times a day (BID) | ORAL | 0 refills | Status: DC

## 2017-07-12 ENCOUNTER — Other Ambulatory Visit: Payer: Self-pay | Admitting: Family Medicine

## 2017-07-12 DIAGNOSIS — F988 Other specified behavioral and emotional disorders with onset usually occurring in childhood and adolescence: Secondary | ICD-10-CM

## 2017-07-12 MED ORDER — AMPHETAMINE-DEXTROAMPHETAMINE 15 MG PO TABS: 15.0000 mg | ORAL_TABLET | Freq: Two times a day (BID) | ORAL | 0 refills | Status: DC

## 2017-07-12 NOTE — Telephone Encounter (Signed)
Left vm advising pt rx was sent to pharmacy.

## 2017-07-12 NOTE — Addendum Note (Signed)
Addended by: Malva LimesFISHER, DONALD E on: 07/12/2017 03:20 PM   Modules accepted: Orders

## 2017-07-12 NOTE — Telephone Encounter (Signed)
Patient is requesting a refill on the following medication ° °amphetamine-dextroamphetamine (ADDERALL) 15 MG tablet ° °[facility] [place] °

## 2017-08-08 ENCOUNTER — Other Ambulatory Visit: Payer: Self-pay | Admitting: Family Medicine

## 2017-08-08 DIAGNOSIS — F988 Other specified behavioral and emotional disorders with onset usually occurring in childhood and adolescence: Secondary | ICD-10-CM

## 2017-08-08 MED ORDER — AMPHETAMINE-DEXTROAMPHETAMINE 15 MG PO TABS: 15.0000 mg | ORAL_TABLET | Freq: Two times a day (BID) | ORAL | 0 refills | Status: DC

## 2017-08-08 NOTE — Telephone Encounter (Signed)
Patient is requesting a refill on the following medication  amphetamine-dextroamphetamine (ADDERALL) 15 MG tablet  [facility] [place]

## 2017-09-06 ENCOUNTER — Other Ambulatory Visit: Payer: Self-pay | Admitting: Family Medicine

## 2017-09-06 DIAGNOSIS — F988 Other specified behavioral and emotional disorders with onset usually occurring in childhood and adolescence: Secondary | ICD-10-CM

## 2017-09-06 MED ORDER — AMPHETAMINE-DEXTROAMPHETAMINE 15 MG PO TABS: 15.0000 mg | ORAL_TABLET | Freq: Two times a day (BID) | ORAL | 0 refills | Status: DC

## 2017-09-06 NOTE — Telephone Encounter (Signed)
Pt contacted office for refill request on the following medications: ° °amphetamine-dextroamphetamine (ADDERALL) 15 MG tablet  ° °[facility] [place] ° °Please advise. Thanks TNP °

## 2017-10-05 ENCOUNTER — Other Ambulatory Visit: Payer: Self-pay | Admitting: Family Medicine

## 2017-10-05 DIAGNOSIS — F988 Other specified behavioral and emotional disorders with onset usually occurring in childhood and adolescence: Secondary | ICD-10-CM

## 2017-10-05 MED ORDER — AMPHETAMINE-DEXTROAMPHETAMINE 15 MG PO TABS: 15.0000 mg | ORAL_TABLET | Freq: Two times a day (BID) | ORAL | 0 refills | Status: DC

## 2017-10-05 NOTE — Telephone Encounter (Signed)
Patient needs refill on Adderall 15 mg. [facility] [place]

## 2017-11-04 ENCOUNTER — Other Ambulatory Visit: Payer: Self-pay | Admitting: Family Medicine

## 2017-11-04 DIAGNOSIS — F988 Other specified behavioral and emotional disorders with onset usually occurring in childhood and adolescence: Secondary | ICD-10-CM

## 2017-11-04 MED ORDER — AMPHETAMINE-DEXTROAMPHETAMINE 15 MG PO TABS: 15.0000 mg | ORAL_TABLET | Freq: Two times a day (BID) | ORAL | 0 refills | Status: DC

## 2017-11-04 NOTE — Telephone Encounter (Signed)
Pt requesting a refill of Adderall 15 MG [facility] [place].  States he is almost out of medication.

## 2017-12-07 ENCOUNTER — Other Ambulatory Visit: Payer: Self-pay | Admitting: Family Medicine

## 2017-12-07 DIAGNOSIS — F988 Other specified behavioral and emotional disorders with onset usually occurring in childhood and adolescence: Secondary | ICD-10-CM

## 2017-12-07 MED ORDER — AMPHETAMINE-DEXTROAMPHETAMINE 15 MG PO TABS: 15.0000 mg | ORAL_TABLET | Freq: Two times a day (BID) | ORAL | 0 refills | Status: DC

## 2017-12-07 NOTE — Telephone Encounter (Signed)
Pt needs refill on adderall 15mg   [facility] [place]  teri

## 2017-12-10 DIAGNOSIS — T63511A Toxic effect of contact with stingray, accidental (unintentional), initial encounter: Secondary | ICD-10-CM | POA: Diagnosis not present

## 2018-01-06 ENCOUNTER — Other Ambulatory Visit: Payer: Self-pay | Admitting: Family Medicine

## 2018-01-06 DIAGNOSIS — F988 Other specified behavioral and emotional disorders with onset usually occurring in childhood and adolescence: Secondary | ICD-10-CM

## 2018-01-06 MED ORDER — AMPHETAMINE-DEXTROAMPHETAMINE 15 MG PO TABS: 15.0000 mg | ORAL_TABLET | Freq: Two times a day (BID) | ORAL | 0 refills | Status: DC

## 2018-01-06 NOTE — Telephone Encounter (Signed)
Its been over a year since his last visit. Have sent refill, but he needs to schedule follow up o.v. This month.

## 2018-01-06 NOTE — Telephone Encounter (Signed)
LMTCB 01/06/2018   Please schedule pt for a follow up visit when he calls back.    Thanks,   -Vernona RiegerLaura

## 2018-01-06 NOTE — Telephone Encounter (Signed)
Pt needs refill on his adderall 15 mg  [facility] [place]  Thanks  teri

## 2018-01-12 NOTE — Telephone Encounter (Signed)
Tried calling patient. Left message to call back. 

## 2018-01-26 NOTE — Telephone Encounter (Signed)
Patient advised. Follow up appointment scheduled.  

## 2018-01-30 ENCOUNTER — Ambulatory Visit (INDEPENDENT_AMBULATORY_CARE_PROVIDER_SITE_OTHER): Payer: Commercial Managed Care - PPO | Admitting: Family Medicine

## 2018-01-30 ENCOUNTER — Encounter: Payer: Self-pay | Admitting: Family Medicine

## 2018-01-30 VITALS — BP 126/77 | HR 84 | Temp 98.0°F | Resp 16 | Wt 182.0 lb

## 2018-01-30 DIAGNOSIS — F988 Other specified behavioral and emotional disorders with onset usually occurring in childhood and adolescence: Secondary | ICD-10-CM

## 2018-01-30 NOTE — Progress Notes (Signed)
       Patient: Patrick GarbeMatthew Kana Male    DOB: [birth year]   27 y.o.   MRN: [id number] Visit Date: 01/30/2018  Today's Provider: Mila Merryonald Fisher, MD   Chief Complaint  Patient presents with  . Follow-up   Subjective:    HPI  Attention deficit disorder (ADD) without hyperactivity From 10/12/2016-increased Adderall from 10 mg to 15 mg bid.   Patient states since increase of Adderall, medication seems to be working well. Having no adverse effects, is sleeping well. Appetite is good. Focuses much better at work with medication. Doesn't always take on the weekends when he is not working.   Wt Readings from Last 5 Encounters:  01/30/18 182 lb (82.6 kg)  10/12/16 187 lb (84.8 kg)  10/23/13 185 lb (83.9 kg)     No Known Allergies   Current Outpatient Medications:  .  amphetamine-dextroamphetamine (ADDERALL) 15 MG tablet, Take 1 tablet by mouth 2 (two) times daily with a meal., Disp: 60 tablet, Rfl: 0 .  naproxen (NAPROSYN) 500 MG tablet, Take 1 tablet (500 mg total) by mouth 2 (two) times daily. (Patient not taking: Reported on 01/30/2018), Disp: 30 tablet, Rfl: 3  Review of Systems  Constitutional: Negative for appetite change, chills and fever.  Respiratory: Negative for chest tightness, shortness of breath and wheezing.   Cardiovascular: Negative for chest pain and palpitations.  Gastrointestinal: Negative for abdominal pain, nausea and vomiting.    Social History   Tobacco Use  . Smoking status: Never Smoker  . Smokeless tobacco: Former Engineer, waterUser  Substance Use Topics  . Alcohol use: Yes    Alcohol/week: 9.6 oz    Types: 16 Cans of beer per week    Comment: usually 2 beers a night and 5-6 on weekends.    Objective:   BP 126/77 (BP Location: Right Arm, Patient Position: Sitting, Cuff Size: Large)   Pulse 84   Temp 98 F (36.7 C) (Oral)   Resp 16   Wt 182 lb (82.6 kg)   SpO2 98%   BMI 21.58 kg/m  Vitals:   01/30/18 1646  BP: 126/77  Pulse: 84  Resp: 16  Temp: 98 F  (36.7 C)  TempSrc: Oral  SpO2: 98%  Weight: 182 lb (82.6 kg)     Physical Exam   General Appearance:    Alert, cooperative, no distress  Eyes:    PERRL, conjunctiva/corneas clear, EOM's intact       Lungs:     Clear to auscultation bilaterally, respirations unlabored  Heart:    Regular rate and rhythm  Neurologic:   Awake, alert, oriented x 3. No apparent focal neurological           defect.           Assessment & Plan:     1. Attention deficit disorder (ADD) without hyperactivity Doing well on current dose of Adderall. Continue current medications.  Follow up annually.        Mila Merryonald Fisher, MD  [facility]

## 2018-02-06 ENCOUNTER — Other Ambulatory Visit: Payer: Self-pay | Admitting: Family Medicine

## 2018-02-06 DIAGNOSIS — F988 Other specified behavioral and emotional disorders with onset usually occurring in childhood and adolescence: Secondary | ICD-10-CM

## 2018-02-06 MED ORDER — AMPHETAMINE-DEXTROAMPHETAMINE 15 MG PO TABS: 15.0000 mg | ORAL_TABLET | Freq: Two times a day (BID) | ORAL | 0 refills | Status: DC

## 2018-03-09 ENCOUNTER — Other Ambulatory Visit: Payer: Self-pay | Admitting: Family Medicine

## 2018-03-09 DIAGNOSIS — F988 Other specified behavioral and emotional disorders with onset usually occurring in childhood and adolescence: Secondary | ICD-10-CM

## 2018-03-09 MED ORDER — AMPHETAMINE-DEXTROAMPHETAMINE 15 MG PO TABS: 15.0000 mg | ORAL_TABLET | Freq: Two times a day (BID) | ORAL | 0 refills | Status: DC

## 2018-03-09 NOTE — Telephone Encounter (Signed)
Please advise 

## 2018-03-09 NOTE — Telephone Encounter (Signed)
Needs refill of Adderall 15 mg. [facility] [place]

## 2018-04-11 ENCOUNTER — Other Ambulatory Visit: Payer: Self-pay | Admitting: Family Medicine

## 2018-04-11 DIAGNOSIS — F988 Other specified behavioral and emotional disorders with onset usually occurring in childhood and adolescence: Secondary | ICD-10-CM

## 2018-04-11 MED ORDER — AMPHETAMINE-DEXTROAMPHETAMINE 15 MG PO TABS: 15.0000 mg | ORAL_TABLET | Freq: Two times a day (BID) | ORAL | 0 refills | Status: DC

## 2018-04-11 NOTE — Telephone Encounter (Signed)
Pt needs refill  Adderall 15 mg  [facility]  [employer]

## 2018-05-10 ENCOUNTER — Other Ambulatory Visit: Payer: Self-pay | Admitting: Family Medicine

## 2018-05-10 DIAGNOSIS — F988 Other specified behavioral and emotional disorders with onset usually occurring in childhood and adolescence: Secondary | ICD-10-CM

## 2018-05-10 MED ORDER — AMPHETAMINE-DEXTROAMPHETAMINE 15 MG PO TABS: 15.0000 mg | ORAL_TABLET | Freq: Two times a day (BID) | ORAL | 0 refills | Status: DC

## 2018-05-10 NOTE — Telephone Encounter (Signed)
Please advise 

## 2018-05-10 NOTE — Telephone Encounter (Signed)
Pt contacted office for refill request on the following medications:  amphetamine-dextroamphetamine (ADDERALL) 15 MG tablet   [facility] [place]  Please advise. Thanks TNP

## 2018-06-09 ENCOUNTER — Other Ambulatory Visit: Payer: Self-pay

## 2018-06-09 DIAGNOSIS — F988 Other specified behavioral and emotional disorders with onset usually occurring in childhood and adolescence: Secondary | ICD-10-CM

## 2018-06-09 MED ORDER — AMPHETAMINE-DEXTROAMPHETAMINE 15 MG PO TABS: 15.0000 mg | ORAL_TABLET | Freq: Two times a day (BID) | ORAL | 0 refills | Status: DC

## 2018-06-09 NOTE — Telephone Encounter (Signed)
Patient called requesting refills on medication[facility] [place]. Thanks!

## 2018-07-10 ENCOUNTER — Other Ambulatory Visit: Payer: Self-pay | Admitting: Family Medicine

## 2018-07-10 DIAGNOSIS — F988 Other specified behavioral and emotional disorders with onset usually occurring in childhood and adolescence: Secondary | ICD-10-CM

## 2018-07-10 MED ORDER — AMPHETAMINE-DEXTROAMPHETAMINE 15 MG PO TABS: 15.0000 mg | ORAL_TABLET | Freq: Two times a day (BID) | ORAL | 0 refills | Status: DC

## 2018-07-10 NOTE — Telephone Encounter (Signed)
Pt needing a refill on: ° °amphetamine-dextroamphetamine (ADDERALL) 15 MG tablet ° °Please fill at: ° °[id number] [place], [place] - [address] [phone] (Phone) °[phone] (Fax)  ° °Thanks, °[facility] ° °

## 2018-07-31 DIAGNOSIS — H5213 Myopia, bilateral: Secondary | ICD-10-CM | POA: Diagnosis not present

## 2018-07-31 DIAGNOSIS — H52223 Regular astigmatism, bilateral: Secondary | ICD-10-CM | POA: Diagnosis not present

## 2018-08-10 ENCOUNTER — Other Ambulatory Visit: Payer: Self-pay | Admitting: Family Medicine

## 2018-08-10 DIAGNOSIS — F988 Other specified behavioral and emotional disorders with onset usually occurring in childhood and adolescence: Secondary | ICD-10-CM

## 2018-08-10 MED ORDER — AMPHETAMINE-DEXTROAMPHETAMINE 15 MG PO TABS: 15.0000 mg | ORAL_TABLET | Freq: Two times a day (BID) | ORAL | 0 refills | Status: DC

## 2018-08-10 NOTE — Telephone Encounter (Signed)
°  Pt needing a refill on: ° °amphetamine-dextroamphetamine (ADDERALL) 15 MG tablet ° °Please fill at: ° °[id number] [place], [place] - [address] [phone] (Phone) °[phone] (Fax)  ° °Thanks, °[facility] ° °

## 2018-09-08 ENCOUNTER — Other Ambulatory Visit: Payer: Self-pay | Admitting: Family Medicine

## 2018-09-08 DIAGNOSIS — F988 Other specified behavioral and emotional disorders with onset usually occurring in childhood and adolescence: Secondary | ICD-10-CM

## 2018-09-08 MED ORDER — AMPHETAMINE-DEXTROAMPHETAMINE 15 MG PO TABS: 15.0000 mg | ORAL_TABLET | Freq: Two times a day (BID) | ORAL | 0 refills | Status: DC

## 2018-09-08 NOTE — Telephone Encounter (Signed)
pt needs a refill on   Adderall 15mg   [facility] [employer], [place]

## 2018-10-09 ENCOUNTER — Other Ambulatory Visit: Payer: Self-pay | Admitting: *Deleted

## 2018-10-09 DIAGNOSIS — F988 Other specified behavioral and emotional disorders with onset usually occurring in childhood and adolescence: Secondary | ICD-10-CM

## 2018-10-09 MED ORDER — AMPHETAMINE-DEXTROAMPHETAMINE 15 MG PO TABS: 15.0000 mg | ORAL_TABLET | Freq: Two times a day (BID) | ORAL | 0 refills | Status: DC

## 2018-11-09 ENCOUNTER — Other Ambulatory Visit: Payer: Self-pay | Admitting: *Deleted

## 2018-11-09 DIAGNOSIS — F988 Other specified behavioral and emotional disorders with onset usually occurring in childhood and adolescence: Secondary | ICD-10-CM

## 2018-11-10 MED ORDER — AMPHETAMINE-DEXTROAMPHETAMINE 15 MG PO TABS: 15.0000 mg | ORAL_TABLET | Freq: Two times a day (BID) | ORAL | 0 refills | Status: DC

## 2018-12-12 ENCOUNTER — Other Ambulatory Visit: Payer: Self-pay | Admitting: Family Medicine

## 2018-12-12 DIAGNOSIS — F988 Other specified behavioral and emotional disorders with onset usually occurring in childhood and adolescence: Secondary | ICD-10-CM

## 2018-12-12 MED ORDER — AMPHETAMINE-DEXTROAMPHETAMINE 15 MG PO TABS: 15.0000 mg | ORAL_TABLET | Freq: Two times a day (BID) | ORAL | 0 refills | Status: DC

## 2018-12-12 NOTE — Telephone Encounter (Signed)
Pt needs refill on Adderall 15  [facility]

## 2019-01-11 ENCOUNTER — Other Ambulatory Visit: Payer: Self-pay | Admitting: Family Medicine

## 2019-01-11 DIAGNOSIS — F988 Other specified behavioral and emotional disorders with onset usually occurring in childhood and adolescence: Secondary | ICD-10-CM

## 2019-01-11 MED ORDER — AMPHETAMINE-DEXTROAMPHETAMINE 15 MG PO TABS: 15.0000 mg | ORAL_TABLET | Freq: Two times a day (BID) | ORAL | 0 refills | Status: DC

## 2019-01-11 NOTE — Telephone Encounter (Signed)
Pt needs refill on his Adderall 15mg   [facility] [address] [place]  CB#  [phone]  teri

## 2019-02-12 ENCOUNTER — Other Ambulatory Visit: Payer: Self-pay | Admitting: Family Medicine

## 2019-02-12 DIAGNOSIS — F988 Other specified behavioral and emotional disorders with onset usually occurring in childhood and adolescence: Secondary | ICD-10-CM

## 2019-02-12 MED ORDER — AMPHETAMINE-DEXTROAMPHETAMINE 15 MG PO TABS: 15.0000 mg | ORAL_TABLET | Freq: Two times a day (BID) | ORAL | 0 refills | Status: DC

## 2019-02-12 NOTE — Telephone Encounter (Signed)
Pt needing a refill on:  amphetamine-dextroamphetamine (ADDERALL) 15 MG tablet  Please fill at:  [id number] [place], [place] - [address] [phone] (Phone) [phone] (Fax)   Thanks, [employer]

## 2019-03-16 ENCOUNTER — Other Ambulatory Visit: Payer: Self-pay | Admitting: Family Medicine

## 2019-03-16 DIAGNOSIS — F988 Other specified behavioral and emotional disorders with onset usually occurring in childhood and adolescence: Secondary | ICD-10-CM

## 2019-03-16 MED ORDER — AMPHETAMINE-DEXTROAMPHETAMINE 15 MG PO TABS: 15.0000 mg | ORAL_TABLET | Freq: Two times a day (BID) | ORAL | 0 refills | Status: DC

## 2019-03-16 NOTE — Telephone Encounter (Signed)
Patient is overdue for follow up. Needs office visit scheduled before next refill

## 2019-03-16 NOTE — Telephone Encounter (Signed)
Pt needing a refill on:  amphetamine-dextroamphetamine (ADDERALL) 15 MG tablet  Please fill at:  [id number] [place],  - [address] [phone] (Phone) [phone] (Fax)   Thanks, [employer]

## 2019-03-16 NOTE — Telephone Encounter (Signed)
Please review. Thanks!  

## 2019-04-12 ENCOUNTER — Other Ambulatory Visit: Payer: Self-pay | Admitting: Family Medicine

## 2019-04-12 DIAGNOSIS — F988 Other specified behavioral and emotional disorders with onset usually occurring in childhood and adolescence: Secondary | ICD-10-CM

## 2019-04-12 MED ORDER — AMPHETAMINE-DEXTROAMPHETAMINE 15 MG PO TABS: 15.0000 mg | ORAL_TABLET | Freq: Two times a day (BID) | ORAL | 0 refills | Status: DC

## 2019-04-12 NOTE — Telephone Encounter (Signed)
Pt needs refill on  Adderall 15 mg  [facility] [place]

## 2019-05-15 ENCOUNTER — Other Ambulatory Visit: Payer: Self-pay | Admitting: Family Medicine

## 2019-05-15 DIAGNOSIS — F988 Other specified behavioral and emotional disorders with onset usually occurring in childhood and adolescence: Secondary | ICD-10-CM

## 2019-05-15 MED ORDER — AMPHETAMINE-DEXTROAMPHETAMINE 15 MG PO TABS: 15.0000 mg | ORAL_TABLET | Freq: Two times a day (BID) | ORAL | 0 refills | Status: DC

## 2019-05-15 NOTE — Telephone Encounter (Signed)
From [facility], Please review 

## 2019-05-15 NOTE — Telephone Encounter (Addendum)
Pt needs refill on generic adderall 15 [facility] [place] [address]. Pt last seen 01/30/2018. Pt has schedule appt for 06-19-2019

## 2019-06-13 ENCOUNTER — Other Ambulatory Visit: Payer: Self-pay | Admitting: Family Medicine

## 2019-06-13 DIAGNOSIS — F988 Other specified behavioral and emotional disorders with onset usually occurring in childhood and adolescence: Secondary | ICD-10-CM

## 2019-06-13 NOTE — Telephone Encounter (Signed)
Medication Refill - Medication: amphetamine-dextroamphetamine (ADDERALL) 15 MG tablet  Has the patient contacted their pharmacy? No. (Agent: If no, request that the patient contact the pharmacy for the refill.) (Agent: If yes, when and what did the pharmacy advise?)  Preferred Pharmacy (with phone number or street name): [id number] [place], [place] [address] Phone:  [phone]  Fax:  [phone]       Agent: Please be advised that RX refills may take up to 3 business days. We ask that you follow-up with your pharmacy.

## 2019-06-13 NOTE — Telephone Encounter (Signed)
Requested medication (s) are due for refill today: yes  Requested medication (s) are on the active medication list: yes  Last refill:  05/15/2019  Future visit scheduled: yes  Notes to clinic:  refill cannot be delegated    Requested Prescriptions  Pending Prescriptions Disp Refills   amphetamine-dextroamphetamine (ADDERALL) 15 MG tablet 60 tablet 0    Sig: Take 1 tablet by mouth 2 (two) times daily with a meal.      Not Delegated - Psychiatry:  Stimulants/ADHD Failed - 06/13/2019  1:48 PM      Failed - This refill cannot be delegated      Failed - Urine Drug Screen completed in last 360 days.      Failed - Valid encounter within last 3 months    Recent Outpatient Visits           1 year ago Attention deficit disorder (ADD) without hyperactivity   [facility] Birdie Sons, MD   2 years ago Annual physical exam   [facility] Birdie Sons, MD       Future Appointments             In 6 days Fisher, Kirstie Peri, MD [facility] [address]

## 2019-06-14 MED ORDER — AMPHETAMINE-DEXTROAMPHETAMINE 15 MG PO TABS: 15.0000 mg | ORAL_TABLET | Freq: Two times a day (BID) | ORAL | 0 refills | Status: DC

## 2019-06-19 ENCOUNTER — Encounter: Payer: Self-pay | Admitting: Family Medicine

## 2019-06-19 ENCOUNTER — Other Ambulatory Visit: Payer: Self-pay

## 2019-06-19 ENCOUNTER — Ambulatory Visit (INDEPENDENT_AMBULATORY_CARE_PROVIDER_SITE_OTHER): Payer: Commercial Managed Care - PPO | Admitting: Family Medicine

## 2019-06-19 VITALS — BP 140/88 | HR 89 | Temp 97.1°F | Ht 77.0 in | Wt 183.8 lb

## 2019-06-19 DIAGNOSIS — F988 Other specified behavioral and emotional disorders with onset usually occurring in childhood and adolescence: Secondary | ICD-10-CM

## 2019-06-19 NOTE — Progress Notes (Signed)
       Patient: Patrick Ford Male    DOB: Mar 09, 1991   28 y.o.   MRN: [id number] Visit Date: 06/19/2019  Today's Provider: Lelon Huh, MD   Chief Complaint  Patient presents with  . ADD   Subjective:     HPI   Follow up for ADD:  The patient was last seen for this over 1 years ago. Changes made at last visit include no changes.  He reports good compliance with treatment. He feels that condition is stable. He is not having side effects.  He sometimes feel like morning dose of medication wears off before taking the second dose.   Wt Readings from Last 3 Encounters:  06/19/19 183 lb 12.8 oz (83.4 kg)  01/30/18 182 lb (82.6 kg)  10/12/16 187 lb (84.8 kg)    ------------------------------------------------------------------------------------  No Known Allergies   Current Outpatient Medications:  .  amphetamine-dextroamphetamine (ADDERALL) 15 MG tablet, Take 1 tablet by mouth 2 (two) times daily with a meal., Disp: 60 tablet, Rfl: 0  Review of Systems  Constitutional: Negative.   Respiratory: Negative.   Cardiovascular: Negative.   Musculoskeletal: Negative.   Psychiatric/Behavioral: Positive for decreased concentration.    Social History   Tobacco Use  . Smoking status: Never Smoker  . Smokeless tobacco: Former Network engineer Use Topics  . Alcohol use: Yes    Alcohol/week: 16.0 standard drinks    Types: 16 Cans of beer per week    Comment: usually 2 beers a night and 5-6 on weekends.       Objective:   BP (!) 145/87 (BP Location: Right Arm, Patient Position: Sitting, Cuff Size: Large)   Pulse 89   Temp (!) 97.1 F (36.2 C) (Temporal)   Ht 6\' 5"  (1.956 m)   Wt 183 lb 12.8 oz (83.4 kg)   SpO2 99%   BMI 21.80 kg/m  Vitals:   06/19/19 1610  BP: (!) 145/87  Pulse: 89  Temp: (!) 97.1 F (36.2 C)  TempSrc: Temporal  SpO2: 99%  Weight: 183 lb 12.8 oz (83.4 kg)  Height: 6\' 5"  (1.956 m)  Body mass index is 21.8 kg/m.   Physical  Exam   General Appearance:    Well developed, well nourished male in no acute distress  Eyes:    PERRL, conjunctiva/corneas clear, EOM's intact       Lungs:     Clear to auscultation bilaterally, respirations unlabored  Heart:    Normal heart rate. Normal rhythm. No murmurs, rubs, or gallops.   MS:   All extremities are intact.   Neurologic:   Awake, alert, oriented x 3. No apparent focal neurological           defect.           Assessment & Plan    1. Attention deficit disorder (ADD) without hyperactivity Doing well on current dose of Adderall. Discussed possibility of taking a lower dose more frequently. He decided to continue current regiment for the time being.   He declined flu vaccine.   The entirety of the information documented in the History of Present Illness, Review of Systems and Physical Exam were personally obtained by me. Portions of this information were initially documented by Idelle Jo, CMA and reviewed by me for thoroughness and accuracy.     Lelon Huh, MD  [facility]

## 2019-07-13 ENCOUNTER — Other Ambulatory Visit: Payer: Self-pay | Admitting: Family Medicine

## 2019-07-13 DIAGNOSIS — F988 Other specified behavioral and emotional disorders with onset usually occurring in childhood and adolescence: Secondary | ICD-10-CM

## 2019-07-13 NOTE — Telephone Encounter (Signed)
Medication Refill - Medication: adderall   Has the patient contacted their pharmacy? Yes.   PT states he only has 2 pills left. Please advise.  (Agent: If no, request that the patient contact the pharmacy for the refill.) (Agent: If yes, when and what did the pharmacy advise?)  Preferred Pharmacy (with phone number or street name):  [id number] [place], [place] - [address]  [address] [place] [address]  Phone: [phone] Fax: [phone]  Not a 24 hour pharmacy; exact hours not known.     Agent: Please be advised that RX refills may take up to 3 business days. We ask that you follow-up with your pharmacy.

## 2019-07-13 NOTE — Telephone Encounter (Signed)
Requested medication (s) are due for refill today: yes  Requested medication (s) are on the active medication list: yes  Last refill:  06/13/2019  Future visit scheduled: yes  Notes to clinic:  not delegated    Requested Prescriptions  Pending Prescriptions Disp Refills   amphetamine-dextroamphetamine (ADDERALL) 15 MG tablet 60 tablet 0    Sig: Take 1 tablet by mouth 2 (two) times daily with a meal.      Not Delegated - Psychiatry:  Stimulants/ADHD Failed - 07/13/2019  1:55 PM      Failed - This refill cannot be delegated      Failed - Urine Drug Screen completed in last 360 days.      Passed - Valid encounter within last 3 months    Recent Outpatient Visits           3 weeks ago Attention deficit disorder (ADD) without hyperactivity   [facility] Malva Limes, MD   1 year ago Attention deficit disorder (ADD) without hyperactivity   [facility] Malva Limes, MD   2 years ago Annual physical exam   [facility] Malva Limes, MD

## 2019-07-14 MED ORDER — AMPHETAMINE-DEXTROAMPHETAMINE 15 MG PO TABS: 15.0000 mg | ORAL_TABLET | Freq: Two times a day (BID) | ORAL | 0 refills | Status: DC

## 2019-08-14 ENCOUNTER — Telehealth: Payer: Self-pay | Admitting: Family Medicine

## 2019-08-14 DIAGNOSIS — F988 Other specified behavioral and emotional disorders with onset usually occurring in childhood and adolescence: Secondary | ICD-10-CM

## 2019-08-14 NOTE — Telephone Encounter (Signed)
Medication Refill - Medication: adderall   Has the patient contacted their pharmacy? No. Pt states he only has 1 day left. Please advise.  (Agent: If no, request that the patient contact the pharmacy for the refill.) (Agent: If yes, when and what did the pharmacy advise?)  Preferred Pharmacy (with phone number or street name):  [id number] [place], [place] - [address]  [address] [place] [address]  Phone: [phone] Fax: [phone]  Not a 24 hour pharmacy; exact hours not known.     Agent: Please be advised that RX refills may take up to 3 business days. We ask that you follow-up with your pharmacy.

## 2019-08-17 MED ORDER — AMPHETAMINE-DEXTROAMPHETAMINE 15 MG PO TABS: 15.0000 mg | ORAL_TABLET | Freq: Two times a day (BID) | ORAL | 0 refills | Status: DC

## 2019-09-14 ENCOUNTER — Other Ambulatory Visit: Payer: Self-pay | Admitting: Family Medicine

## 2019-09-14 DIAGNOSIS — F988 Other specified behavioral and emotional disorders with onset usually occurring in childhood and adolescence: Secondary | ICD-10-CM

## 2019-09-14 NOTE — Telephone Encounter (Signed)
Copied from CRM [id number]. Topic: Quick Communication - Rx Refill/Question >> Sep 14, 2019  2:50 PM [place] Small, [place] A wrote: Medication: amphetamine-dextroamphetamine (ADDERALL) 15 MG tablet   Has the patient contacted their pharmacy? {Yes (Agent: If no, request that the patient contact the pharmacy for the refill.) (Agent: If yes, when and what did the pharmacy advise?)Contact PCP  Preferred Pharmacy (with phone number or street name): [id number] [place], [place] - [address]  Phone:  [phone] Fax:  [phone]     Agent: Please be advised that RX refills may take up to 3 business days. We ask that you follow-up with your pharmacy.

## 2019-09-14 NOTE — Telephone Encounter (Signed)
Requested medication (s) are due for refill today: yes  Requested medication (s) are on the active medication list: yes  Last refill:  08/14/19  Future visit scheduled: yes  Notes to clinic:  not delegated    Requested Prescriptions  Pending Prescriptions Disp Refills   amphetamine-dextroamphetamine (ADDERALL) 15 MG tablet 60 tablet 0    Sig: Take 1 tablet by mouth 2 (two) times daily with a meal.      Not Delegated - Psychiatry:  Stimulants/ADHD Failed - 09/14/2019  2:57 PM      Failed - This refill cannot be delegated      Failed - Urine Drug Screen completed in last 360 days.      Passed - Valid encounter within last 3 months    Recent Outpatient Visits           2 months ago Attention deficit disorder (ADD) without hyperactivity   [facility] Malva Limes, MD   1 year ago Attention deficit disorder (ADD) without hyperactivity   [facility] Malva Limes, MD   2 years ago Annual physical exam   [facility] Malva Limes, MD

## 2019-09-15 MED ORDER — AMPHETAMINE-DEXTROAMPHETAMINE 15 MG PO TABS: 15.0000 mg | ORAL_TABLET | Freq: Two times a day (BID) | ORAL | 0 refills | Status: DC

## 2019-10-15 NOTE — Progress Notes (Signed)
Complete physical exam    Patient: Patrick Ford   DOB: March 26, 1991   28 y.o. Male  MRN: [id number] Visit Date: 10/16/2019  Today's healthcare provider: Mila Merry, MD  Subjective:     Velora Mediate as a scribe for Mila Merry, MD.,have documented all relevant documentation on the behalf of Mila Merry, MD,as directed by  Mila Merry, MD while in the presence of Mila Merry, MD.  Chief Complaint  Patient presents with  . Annual Exam    Patrick Ford is a 29 y.o. male who presents today for a complete physical exam.  He reports consuming a general diet. Gym/ health club routine includes cardio and mod to heavy weightlifting. He generally feels well. He reports sleeping well. He does not have additional problems to discuss today.  HPI  Follow up for ADD:  The patient was last seen for this 4 months ago. Changes made at last visit include none.  He reports good compliance with treatment. He feels that condition is stable. He is not having side effects.  He takes most days  -----------------------------------------------------------------------------------------   History reviewed. No pertinent past medical history. History reviewed. No pertinent surgical history. Social History   Socioeconomic History  . Marital status: Married    Spouse name: Not on file  . Number of children: Not on file  . Years of education: Not on file  . Highest education level: Not on file  Occupational History    Comment: Works in beer distribution  Tobacco Use  . Smoking status: Never Smoker  . Smokeless tobacco: Former Engineer, water and Sexual Activity  . Alcohol use: Yes    Alcohol/week: 16.0 standard drinks    Types: 16 Cans of beer per week    Comment: usually 2 beers a night and 5-6 on weekends.   . Drug use: No  . Sexual activity: Yes  Other Topics Concern  . Not on file  Social History Narrative  . Not on file   Social Determinants of Health    Financial Resource Strain:   . Difficulty of Paying Living Expenses:   Food Insecurity:   . Worried About Programme researcher, broadcasting/film/video in the Last Year:   . Barista in the Last Year:   Transportation Needs:   . Freight forwarder (Medical):   [place] Kitchen Lack of Transportation (Non-Medical):   Physical Activity:   . Days of Exercise per Week:   . Minutes of Exercise per Session:   Stress:   . Feeling of Stress :   Social Connections:   . Frequency of Communication with Friends and Family:   . Frequency of Social Gatherings with Friends and Family:   . Attends Religious Services:   . Active Member of Clubs or Organizations:   . Attends Banker Meetings:   Marland Kitchen Marital Status:   Intimate Partner Violence:   . Fear of Current or Ex-Partner:   . Emotionally Abused:   Marland Kitchen Physically Abused:   . Sexually Abused:    Family Status  Relation Name Status  . Mother  Alive  . Father  Alive  . Sister  Alive  . Brother  Alive  . Sister  Alive   History reviewed. No pertinent family history. No Known Allergies  Patient Care Team: Malva Limes, MD as PCP - General (Family Medicine)   Medications: Outpatient Medications Prior to Visit  Medication Sig  . amphetamine-dextroamphetamine (ADDERALL) 15 MG tablet Take 1 tablet by mouth  2 (two) times daily with a meal.   No facility-administered medications prior to visit.    Review of Systems  Constitutional: Negative.   HENT: Negative.   Eyes: Negative.   Respiratory: Negative.   Cardiovascular: Negative.   Gastrointestinal: Negative.   Endocrine: Negative.   Genitourinary: Negative.   Musculoskeletal: Negative.   Skin: Negative.   Allergic/Immunologic: Negative.   Neurological: Negative.   Hematological: Negative.   Psychiatric/Behavioral: Negative.         Objective:    BP 136/84 (BP Location: Right Arm, Patient Position: Sitting, Cuff Size: Normal)   Pulse 87   Temp 97.7 F (36.5 C) (Temporal)   Ht 6\' 5"   (1.956 m)   Wt 188 lb 3.2 oz (85.4 kg)   BMI 22.32 kg/m    Physical Exam   General Appearance:    Well developed, well nourished male. Alert, cooperative, in no acute distress, appears stated age  Head:    Normocephalic, without obvious abnormality, atraumatic  Eyes:    PERRL, conjunctiva/corneas clear, EOM's intact, fundi    benign, both eyes       Ears:    Normal TM's. Both ear canals impacted with cerumen.   Nose:   Nares normal, septum midline, mucosa normal, no drainage   or sinus tenderness  Throat:   Lips, mucosa, and tongue normal; teeth and gums normal  Neck:   Supple, symmetrical, trachea midline, no adenopathy;       thyroid:  No enlargement/tenderness/nodules; no carotid   bruit or JVD  Back:     Symmetric, no curvature, ROM normal, no CVA tenderness  Lungs:     Clear to auscultation bilaterally, respirations unlabored  Chest wall:    No tenderness or deformity  Heart:    Normal heart rate. Normal rhythm. No murmurs, rubs, or gallops.  S1 and S2 normal  Abdomen:     Soft, non-tender, bowel sounds active all four quadrants,    no masses, no organomegaly  Genitalia:    deferred  Rectal:    deferred  Extremities:   All extremities are intact. No cyanosis or edema  Pulses:   2+ and symmetric all extremities  Skin:   Skin color, texture, turgor normal, no rashes or lesions  Lymph nodes:   Cervical, supraclavicular, and axillary nodes normal  Neurologic:   CNII-XII intact. Normal strength, sensation and reflexes      throughout    Depression Screen  PHQ 2/9 Scores 06/19/2019 10/12/2016  PHQ - 2 Score 0 1  PHQ- 9 Score - 2    No results found for any visits on 10/16/19.    Assessment & Plan:    Routine Health Maintenance and Physical Exam  Exercise Activities and Dietary recommendations Goals   None     Immunization History  Administered Date(s) Administered  . Tdap 10/29/2011    Health Maintenance  Topic Date Due  . HIV Screening  Never done  .  INFLUENZA VACCINE  01/27/2020  . TETANUS/TDAP  10/28/2021    Discussed health benefits of physical activity, and encouraged him to engage in regular exercise appropriate for his age and condition.  Bilateral cerumen impaction After soaking with Debrox, ear canal was irrigated with water until clear. Patient tolerated procedure well.    No follow-ups on file.     The entirety of the information documented in the History of Present Illness, Review of Systems and Physical Exam were personally obtained by me. Portions of this information were initially documented  by the [place] and reviewed by me for thoroughness and accuracy.      Lelon Huh, MD  [facility] [phone] (phone) [phone] (fax)  [place]

## 2019-10-16 ENCOUNTER — Ambulatory Visit (INDEPENDENT_AMBULATORY_CARE_PROVIDER_SITE_OTHER): Payer: Commercial Managed Care - PPO | Admitting: Family Medicine

## 2019-10-16 ENCOUNTER — Other Ambulatory Visit: Payer: Self-pay

## 2019-10-16 ENCOUNTER — Other Ambulatory Visit: Payer: Self-pay | Admitting: Family Medicine

## 2019-10-16 ENCOUNTER — Encounter: Payer: Self-pay | Admitting: Family Medicine

## 2019-10-16 VITALS — BP 136/84 | HR 87 | Temp 97.7°F | Ht 77.0 in | Wt 188.2 lb

## 2019-10-16 DIAGNOSIS — H6123 Impacted cerumen, bilateral: Secondary | ICD-10-CM

## 2019-10-16 DIAGNOSIS — F988 Other specified behavioral and emotional disorders with onset usually occurring in childhood and adolescence: Secondary | ICD-10-CM

## 2019-10-16 DIAGNOSIS — Z0000 Encounter for general adult medical examination without abnormal findings: Secondary | ICD-10-CM

## 2019-10-17 LAB — LIPID PANEL
Chol/HDL Ratio: 2.7 ratio (ref 0.0–5.0)
Cholesterol, Total: 216 mg/dL — ABNORMAL HIGH (ref 100–199)
HDL: 81 mg/dL (ref >39)
LDL Chol Calc (NIH): 120 mg/dL — ABNORMAL HIGH (ref 0–99)
Triglycerides: 84 mg/dL (ref 0–149)
VLDL Cholesterol Cal: 15 mg/dL (ref 5–40)

## 2019-10-17 LAB — COMPREHENSIVE METABOLIC PANEL
ALT: 7 IU/L (ref 0–44)
AST: 17 IU/L (ref 0–40)
Albumin/Globulin Ratio: 3 — ABNORMAL HIGH (ref 1.2–2.2)
Albumin: 5.1 g/dL (ref 4.1–5.2)
Alkaline Phosphatase: 74 IU/L (ref 39–117)
BUN/Creatinine Ratio: 15 (ref 9–20)
BUN: 16 mg/dL (ref 6–20)
Bilirubin Total: 0.3 mg/dL (ref 0.0–1.2)
CO2: 27 mmol/L (ref 20–29)
Calcium: 9.6 mg/dL (ref 8.7–10.2)
Chloride: 100 mmol/L (ref 96–106)
Creatinine, Ser: 1.07 mg/dL (ref 0.76–1.27)
GFR calc Af Amer: 107 mL/min/{1.73_m2} (ref >59)
GFR calc non Af Amer: 93 mL/min/{1.73_m2} (ref >59)
Globulin, Total: 1.7 g/dL (ref 1.5–4.5)
Glucose: 80 mg/dL (ref 65–99)
Potassium: 4.2 mmol/L (ref 3.5–5.2)
Sodium: 140 mmol/L (ref 134–144)
Total Protein: 6.8 g/dL (ref 6.0–8.5)

## 2019-10-17 MED ORDER — AMPHETAMINE-DEXTROAMPHETAMINE 15 MG PO TABS: 15.0000 mg | ORAL_TABLET | Freq: Two times a day (BID) | ORAL | 0 refills | Status: DC

## 2019-11-14 ENCOUNTER — Other Ambulatory Visit: Payer: Self-pay | Admitting: Family Medicine

## 2019-11-14 DIAGNOSIS — F988 Other specified behavioral and emotional disorders with onset usually occurring in childhood and adolescence: Secondary | ICD-10-CM

## 2019-11-14 MED ORDER — AMPHETAMINE-DEXTROAMPHETAMINE 15 MG PO TABS: 15.0000 mg | ORAL_TABLET | Freq: Two times a day (BID) | ORAL | 0 refills | Status: DC

## 2019-12-13 ENCOUNTER — Other Ambulatory Visit: Payer: Self-pay | Admitting: Family Medicine

## 2019-12-13 DIAGNOSIS — F988 Other specified behavioral and emotional disorders with onset usually occurring in childhood and adolescence: Secondary | ICD-10-CM

## 2019-12-13 MED ORDER — AMPHETAMINE-DEXTROAMPHETAMINE 15 MG PO TABS: 15.0000 mg | ORAL_TABLET | Freq: Two times a day (BID) | ORAL | 0 refills | Status: DC

## 2020-01-14 ENCOUNTER — Other Ambulatory Visit: Payer: Self-pay | Admitting: Family Medicine

## 2020-01-14 DIAGNOSIS — F988 Other specified behavioral and emotional disorders with onset usually occurring in childhood and adolescence: Secondary | ICD-10-CM

## 2020-01-15 MED ORDER — AMPHETAMINE-DEXTROAMPHETAMINE 15 MG PO TABS: 15.0000 mg | ORAL_TABLET | Freq: Two times a day (BID) | ORAL | 0 refills | Status: DC

## 2020-02-13 ENCOUNTER — Other Ambulatory Visit: Payer: Self-pay | Admitting: Family Medicine

## 2020-02-13 DIAGNOSIS — F988 Other specified behavioral and emotional disorders with onset usually occurring in childhood and adolescence: Secondary | ICD-10-CM

## 2020-02-13 MED ORDER — AMPHETAMINE-DEXTROAMPHETAMINE 15 MG PO TABS: 15.0000 mg | ORAL_TABLET | Freq: Two times a day (BID) | ORAL | 0 refills | Status: DC

## 2020-03-13 ENCOUNTER — Other Ambulatory Visit: Payer: Self-pay | Admitting: Physician Assistant

## 2020-03-13 DIAGNOSIS — F988 Other specified behavioral and emotional disorders with onset usually occurring in childhood and adolescence: Secondary | ICD-10-CM

## 2020-03-14 MED ORDER — AMPHETAMINE-DEXTROAMPHETAMINE 15 MG PO TABS: 15.0000 mg | ORAL_TABLET | Freq: Two times a day (BID) | ORAL | 0 refills | Status: DC

## 2020-04-11 ENCOUNTER — Other Ambulatory Visit: Payer: Self-pay | Admitting: Family Medicine

## 2020-04-11 DIAGNOSIS — F988 Other specified behavioral and emotional disorders with onset usually occurring in childhood and adolescence: Secondary | ICD-10-CM

## 2020-04-11 MED ORDER — AMPHETAMINE-DEXTROAMPHETAMINE 15 MG PO TABS: 15.0000 mg | ORAL_TABLET | Freq: Two times a day (BID) | ORAL | 0 refills | Status: DC

## 2020-04-15 ENCOUNTER — Other Ambulatory Visit: Payer: Self-pay | Admitting: Family Medicine

## 2020-04-15 DIAGNOSIS — F988 Other specified behavioral and emotional disorders with onset usually occurring in childhood and adolescence: Secondary | ICD-10-CM

## 2020-04-15 NOTE — Telephone Encounter (Signed)
Requested medication (s) are due for refill today: yes  Requested medication (s) are on the active medication list: yes  Last refill:  04/11/20 #60 0 refills  Future visit scheduled: no   Notes to clinic:  not delegated per protocol , needs office visit      Requested Prescriptions  Pending Prescriptions Disp Refills   amphetamine-dextroamphetamine (ADDERALL) 15 MG tablet 60 tablet 0    Sig: Take 1 tablet by mouth 2 (two) times daily with a meal.      Not Delegated - Psychiatry:  Stimulants/ADHD Failed - 04/15/2020  3:34 PM      Failed - This refill cannot be delegated      Failed - Urine Drug Screen completed in last 360 days.      Failed - Valid encounter within last 3 months    Recent Outpatient Visits           6 months ago Annual physical exam   [facility] Malva Limes, MD   10 months ago Attention deficit disorder (ADD) without hyperactivity   [facility] Malva Limes, MD   2 years ago Attention deficit disorder (ADD) without hyperactivity   [facility] Malva Limes, MD   3 years ago Annual physical exam   [facility] Malva Limes, MD

## 2020-04-15 NOTE — Telephone Encounter (Signed)
PT need a refill  amphetamine-dextroamphetamine (ADDERALL) 15 MG tablet [[id number]]  [id number] - [place], [place] - [address]  [address] [place] [address]  Phone: [phone] Fax: [phone]

## 2020-04-15 NOTE — Addendum Note (Signed)
Addended by: Elby Beck F on: 04/15/2020 03:34 PM   Modules accepted: Orders

## 2020-04-16 MED ORDER — AMPHETAMINE-DEXTROAMPHETAMINE 15 MG PO TABS: 15.0000 mg | ORAL_TABLET | Freq: Two times a day (BID) | ORAL | 0 refills | Status: DC

## 2020-05-16 ENCOUNTER — Other Ambulatory Visit: Payer: Self-pay | Admitting: Family Medicine

## 2020-05-16 DIAGNOSIS — F988 Other specified behavioral and emotional disorders with onset usually occurring in childhood and adolescence: Secondary | ICD-10-CM

## 2020-05-19 ENCOUNTER — Telehealth: Payer: Self-pay | Admitting: Family Medicine

## 2020-05-19 MED ORDER — AMPHETAMINE-DEXTROAMPHETAMINE 15 MG PO TABS: 15.0000 mg | ORAL_TABLET | Freq: Two times a day (BID) | ORAL | 0 refills | Status: DC

## 2020-05-19 NOTE — Telephone Encounter (Signed)
Medication Refill - Medication: Adderall 15 mg  Has the patient contacted their pharmacy? No. (Agent: If no, request that the patient contact the pharmacy for the refill.) (Agent: If yes, when and what did the pharmacy advise?)  Preferred Pharmacy (with phone number or street name): [facility]   Agent: Please be advised that RX refills may take up to 3 business days. We ask that you follow-up with your pharmacy.

## 2020-05-19 NOTE — Telephone Encounter (Signed)
[facility] [place] on today, 05/19/20.

## 2020-06-13 ENCOUNTER — Other Ambulatory Visit: Payer: Self-pay | Admitting: Family Medicine

## 2020-06-13 DIAGNOSIS — F988 Other specified behavioral and emotional disorders with onset usually occurring in childhood and adolescence: Secondary | ICD-10-CM

## 2020-06-13 MED ORDER — AMPHETAMINE-DEXTROAMPHETAMINE 15 MG PO TABS: 15.0000 mg | ORAL_TABLET | Freq: Two times a day (BID) | ORAL | 0 refills | Status: DC

## 2020-07-18 ENCOUNTER — Other Ambulatory Visit: Payer: Self-pay | Admitting: Family Medicine

## 2020-07-18 DIAGNOSIS — F988 Other specified behavioral and emotional disorders with onset usually occurring in childhood and adolescence: Secondary | ICD-10-CM

## 2020-07-18 MED ORDER — AMPHETAMINE-DEXTROAMPHETAMINE 15 MG PO TABS: 15.0000 mg | ORAL_TABLET | Freq: Two times a day (BID) | ORAL | 0 refills | Status: DC

## 2020-08-18 ENCOUNTER — Other Ambulatory Visit: Payer: Self-pay | Admitting: Family Medicine

## 2020-08-18 DIAGNOSIS — F988 Other specified behavioral and emotional disorders with onset usually occurring in childhood and adolescence: Secondary | ICD-10-CM

## 2020-08-18 MED ORDER — AMPHETAMINE-DEXTROAMPHETAMINE 15 MG PO TABS: 15.0000 mg | ORAL_TABLET | Freq: Two times a day (BID) | ORAL | 0 refills | Status: DC

## 2020-09-15 ENCOUNTER — Other Ambulatory Visit: Payer: Self-pay | Admitting: Family Medicine

## 2020-09-15 DIAGNOSIS — F988 Other specified behavioral and emotional disorders with onset usually occurring in childhood and adolescence: Secondary | ICD-10-CM

## 2020-09-16 MED ORDER — AMPHETAMINE-DEXTROAMPHETAMINE 15 MG PO TABS: 15.0000 mg | ORAL_TABLET | Freq: Two times a day (BID) | ORAL | 0 refills | Status: DC

## 2020-10-13 ENCOUNTER — Other Ambulatory Visit: Payer: Self-pay | Admitting: Family Medicine

## 2020-10-13 DIAGNOSIS — F988 Other specified behavioral and emotional disorders with onset usually occurring in childhood and adolescence: Secondary | ICD-10-CM

## 2020-10-14 MED ORDER — AMPHETAMINE-DEXTROAMPHETAMINE 15 MG PO TABS: 15.0000 mg | ORAL_TABLET | Freq: Two times a day (BID) | ORAL | 0 refills | Status: DC

## 2020-11-21 ENCOUNTER — Other Ambulatory Visit: Payer: Self-pay

## 2020-11-21 ENCOUNTER — Encounter: Payer: Self-pay | Admitting: Family Medicine

## 2020-11-21 ENCOUNTER — Other Ambulatory Visit: Payer: Self-pay | Admitting: Family Medicine

## 2020-11-21 ENCOUNTER — Ambulatory Visit (INDEPENDENT_AMBULATORY_CARE_PROVIDER_SITE_OTHER): Payer: Commercial Managed Care - PPO | Admitting: Family Medicine

## 2020-11-21 DIAGNOSIS — F988 Other specified behavioral and emotional disorders with onset usually occurring in childhood and adolescence: Secondary | ICD-10-CM

## 2020-11-21 MED ORDER — AMPHETAMINE-DEXTROAMPHETAMINE 15 MG PO TABS: 15.0000 mg | ORAL_TABLET | Freq: Two times a day (BID) | ORAL | 0 refills | Status: DC

## 2020-11-21 NOTE — Progress Notes (Signed)
      Established patient visit   Patient: Patrick Ford   DOB: 03-28-1991   30 y.o. Male  MRN: [id number] Visit Date: 11/21/2020  Today's healthcare provider: Mila Merry, MD   Chief Complaint  Patient presents with  . ADD   Subjective    HPI  Follow up for ADD:  The patient was last seen for this 1 year ago. Changes made at last visit include none; continue same medication.  He reports good compliance with treatment. He feels that condition is Unchanged. He is not having side effects.   -----------------------------------------------------------------------------------------      Medications: Outpatient Medications Prior to Visit  Medication Sig  . amphetamine-dextroamphetamine (ADDERALL) 15 MG tablet Take 1 tablet by mouth 2 (two) times daily with a meal.   No facility-administered medications prior to visit.    Review of Systems  Constitutional: Negative for appetite change, chills and fever.  Respiratory: Negative for chest tightness, shortness of breath and wheezing.   Cardiovascular: Negative for chest pain and palpitations.  Gastrointestinal: Negative for abdominal pain, nausea and vomiting.       Objective    BP 135/88 (BP Location: Left Arm, Patient Position: Sitting, Cuff Size: Normal)   Pulse 83   Temp 98.7 F (37.1 C) (Temporal)   Resp 16   Ht 6\' 5"  (1.956 m)   Wt 171 lb 3.2 oz (77.7 kg)   BMI 20.30 kg/m     Physical Exam   General: Appearance:    Well developed, well nourished male in no acute distress  Eyes:    PERRL, conjunctiva/corneas clear, EOM's intact       Lungs:     Clear to auscultation bilaterally, respirations unlabored  Heart:    Normal heart rate. Normal rhythm. No murmurs, rubs, or gallops.   MS:   All extremities are intact.   Neurologic:   Awake, alert, oriented x 3. No apparent focal neurological           defect.         Assessment & Plan     1. Attention deficit disorder (ADD) without hyperactivity  -  amphetamine-dextroamphetamine (ADDERALL) 15 MG tablet; Take 1 tablet by mouth 2 (two) times daily with a meal.  Dispense: 60 tablet; Refill: 0         The entirety of the information documented in the History of Present Illness, Review of Systems and Physical Exam were personally obtained by me. Portions of this information were initially documented by the CMA and reviewed by me for thoroughness and accuracy.      , MD  [facility] [phone] (phone) [phone] (fax)  [facility]

## 2020-11-21 NOTE — Patient Instructions (Signed)
.   Covid-19 vaccines: The Covid vaccines have been given to hundreds of millions of people and found to be very effective and are as safe as any other vaccine.  The Johnson [employer] Johnson vaccine has been associated with very rare dangerous blood clots, but only in adult women under the age of 60.  The risk of dying from Covid infections is much higher than having a serious reaction to the vaccine.  I strongly recommend getting fully vaccinated against Covid-19.  I recommend that adult women under 60 get fully vaccinated, but the Moderna and Pfizer vaccines may be safer for those women than the Johnson & Johnson vaccine.   

## 2020-12-21 ENCOUNTER — Other Ambulatory Visit: Payer: Self-pay | Admitting: Family Medicine

## 2020-12-21 DIAGNOSIS — F988 Other specified behavioral and emotional disorders with onset usually occurring in childhood and adolescence: Secondary | ICD-10-CM

## 2020-12-21 MED ORDER — AMPHETAMINE-DEXTROAMPHETAMINE 15 MG PO TABS: 15.0000 mg | ORAL_TABLET | Freq: Two times a day (BID) | ORAL | 0 refills | Status: DC

## 2021-01-02 ENCOUNTER — Encounter: Payer: Commercial Managed Care - PPO | Admitting: Family Medicine

## 2021-01-25 ENCOUNTER — Other Ambulatory Visit: Payer: Self-pay | Admitting: Family Medicine

## 2021-01-25 DIAGNOSIS — F988 Other specified behavioral and emotional disorders with onset usually occurring in childhood and adolescence: Secondary | ICD-10-CM

## 2021-01-26 NOTE — Telephone Encounter (Signed)
Pt is calling to check on the status the medication request. Advised that medication refills can take up to 3 business days. Pt states he is out of medication.

## 2021-01-27 MED ORDER — AMPHETAMINE-DEXTROAMPHETAMINE 15 MG PO TABS: 15.0000 mg | ORAL_TABLET | Freq: Two times a day (BID) | ORAL | 0 refills | Status: DC

## 2021-02-24 ENCOUNTER — Other Ambulatory Visit: Payer: Self-pay | Admitting: Family Medicine

## 2021-02-24 DIAGNOSIS — F988 Other specified behavioral and emotional disorders with onset usually occurring in childhood and adolescence: Secondary | ICD-10-CM

## 2021-02-25 MED ORDER — AMPHETAMINE-DEXTROAMPHETAMINE 15 MG PO TABS: 15.0000 mg | ORAL_TABLET | Freq: Two times a day (BID) | ORAL | 0 refills | Status: DC

## 2021-03-16 ENCOUNTER — Encounter: Payer: Self-pay | Admitting: Family Medicine

## 2021-03-16 ENCOUNTER — Other Ambulatory Visit: Payer: Self-pay

## 2021-03-16 ENCOUNTER — Ambulatory Visit (INDEPENDENT_AMBULATORY_CARE_PROVIDER_SITE_OTHER): Payer: Commercial Managed Care - PPO | Admitting: Family Medicine

## 2021-03-16 VITALS — BP 115/84 | HR 90 | Temp 98.4°F | Ht 77.0 in | Wt 167.0 lb

## 2021-03-16 DIAGNOSIS — N451 Epididymitis: Secondary | ICD-10-CM | POA: Diagnosis not present

## 2021-03-16 DIAGNOSIS — Z0000 Encounter for general adult medical examination without abnormal findings: Secondary | ICD-10-CM | POA: Diagnosis not present

## 2021-03-16 DIAGNOSIS — F988 Other specified behavioral and emotional disorders with onset usually occurring in childhood and adolescence: Secondary | ICD-10-CM

## 2021-03-16 MED ORDER — DOXYCYCLINE HYCLATE 100 MG PO TABS: 100.0000 mg | ORAL_TABLET | Freq: Two times a day (BID) | ORAL | 0 refills | Status: AC

## 2021-03-16 NOTE — Progress Notes (Signed)
Complete physical exam   Patient: Patrick Ford   DOB: [birth year]   30 y.o. Male  MRN: [id number] Visit Date: 03/16/2021  Today's healthcare provider: Mila Merry, MD   Chief Complaint  Patient presents with   Annual Exam   Subjective    Patrick Ford is a 30 y.o. male who presents today for a complete physical exam.  He reports consuming a general diet.  Exercising Regularly.  He generally feels well. He reports sleeping well. He does have additional problems to discuss today.  Groin Pain The patient's primary symptoms include scrotal swelling and testicular pain. The patient's pertinent negatives include no penile discharge or penile pain. This is a new problem. The current episode started in the past 7 days. The problem has been gradually worsening. Associated symptoms include abdominal pain (lower abdominal pain) and nausea. Pertinent negatives include no anorexia, constipation, diarrhea, dysuria, fever, flank pain, frequency, hematuria, hesitancy, urgency, urinary retention or vomiting. There is No reported injury. The testicular pain affects both testicles. There is swelling in both testicles. Nothing aggravates the symptoms. He has tried OTC analgesics for the symptoms. The treatment provided mild relief.    No past medical history on file. Past Surgical History:  Procedure Laterality Date   NO PAST SURGERIES     Social History   Socioeconomic History   Marital status: Married    Spouse name: Not on file   Number of children: Not on file   Years of education: Not on file   Highest education level: Not on file  Occupational History    Comment: Works in beer distribution  Tobacco Use   Smoking status: Never   Smokeless tobacco: Former  Building services engineer Use: Never used  Substance and Sexual Activity   Alcohol use: Yes    Alcohol/week: 16.0 standard drinks    Types: 16 Cans of beer per week    Comment: usually 2 beers a night and 5-6 on weekends.     Drug use: No   Sexual activity: Yes  Other Topics Concern   Not on file  Social History Narrative   Not on file   Social Determinants of Health   Financial Resource Strain: Not on file  Food Insecurity: Not on file  Transportation Needs: Not on file  Physical Activity: Not on file  Stress: Not on file  Social Connections: Not on file  Intimate Partner Violence: Not on file   Family Status  Relation Name Status   Mother  Alive   Father  Alive   Sister  Alive   Sister  Alive   Brother  Alive   MGF  Deceased   Neg Hx  (Not Specified)   Family History  Problem Relation Age of Onset   Healthy Mother    Healthy Sister    Healthy Sister    Healthy Brother    Heart disease Maternal Grandfather    Colon cancer Neg Hx    Prostate cancer Neg Hx    No Known Allergies  Patient Care Team: Malva Limes, MD as PCP - General (Family Medicine)   Medications: Outpatient Medications Prior to Visit  Medication Sig   amphetamine-dextroamphetamine (ADDERALL) 15 MG tablet Take 1 tablet by mouth 2 (two) times daily with a meal.   No facility-administered medications prior to visit.    Review of Systems  Constitutional: Negative.  Negative for fever.  HENT: Negative.    Eyes: Negative.   Respiratory: Negative.  Cardiovascular: Negative.   Gastrointestinal:  Positive for abdominal pain (lower abdominal pain) and nausea. Negative for abdominal distention, anal bleeding, anorexia, blood in stool, constipation, diarrhea, rectal pain and vomiting.  Endocrine: Negative.   Genitourinary:  Positive for scrotal swelling and testicular pain. Negative for decreased urine volume, dysuria, flank pain, frequency, genital sores, hesitancy, penile discharge, penile pain, penile swelling and urgency.  Musculoskeletal: Negative.   Skin: Negative.   Allergic/Immunologic: Negative.   Neurological: Negative.   Hematological: Negative.   Psychiatric/Behavioral: Negative.       Objective     BP 115/84 (BP Location: Right Arm, Patient Position: Sitting, Cuff Size: Large)   Pulse 90   Temp 98.4 F (36.9 C) (Oral)   Ht 6\' 5"  (1.956 m)   Wt 167 lb (75.8 kg)   SpO2 100%   BMI 19.80 kg/m    Physical Exam   General Appearance:    Thin male. Alert, cooperative, in no acute distress, appears stated age  Head:    Normocephalic, without obvious abnormality, atraumatic  Eyes:    PERRL, conjunctiva/corneas clear, EOM's intact, fundi    benign, both eyes       Ears:    Normal TM's and external ear canals, both ears  Neck:   Supple, symmetrical, trachea midline, no adenopathy;       thyroid:  No enlargement/tenderness/nodules; no carotid   bruit or JVD  Back:     Symmetric, no curvature, ROM normal, no CVA tenderness  Lungs:     Clear to auscultation bilaterally, respirations unlabored  Chest wall:    No tenderness or deformity  Heart:    Normal heart rate. Normal rhythm. No murmurs, rubs, or gallops.  S1 and S2 normal  Abdomen:     Soft, non-tender, bowel sounds active all four quadrants,    no masses, no organomegaly  Genitalia:    Small bilateral inguinal defect, no frank hernia. Tender right epiditymus   Rectal:    deferred  Extremities:   All extremities are intact. No cyanosis or edema  Pulses:   2+ and symmetric all extremities  Skin:   Skin color, texture, turgor normal, no rashes or lesions  Lymph nodes:   Cervical, supraclavicular, and axillary nodes normal  Neurologic:   CNII-XII intact. Normal strength, sensation and reflexes      throughout     Last depression screening scores PHQ 2/9 Scores 11/21/2020 10/16/2019 06/19/2019  PHQ - 2 Score 0 0 0  PHQ- 9 Score 2 - -   Last fall risk screening Fall Risk  11/21/2020  Falls in the past year? 0  Number falls in past yr: 0  Injury with Fall? 0  Follow up Falls evaluation completed   Last Audit-C alcohol use screening No flowsheet data found. A score of 3 or more in women, and 4 or more in men indicates increased  risk for alcohol abuse, EXCEPT if all of the points are from question 1   No results found for any visits on 03/16/21.  Assessment & Plan    Routine Health Maintenance and Physical Exam  Exercise Activities and Dietary recommendations  Goals   None     Immunization History  Administered Date(s) Administered   Tdap 10/29/2011    Health Maintenance  Topic Date Due   COVID-19 Vaccine (1) Never done   HIV Screening  Never done   Hepatitis C Screening  Never done   INFLUENZA VACCINE  09/25/2021 (Originally 01/26/2021)   TETANUS/TDAP  10/28/2021  HPV VACCINES  Aged Out    Discussed health benefits of physical activity, and encouraged him to engage in regular exercise appropriate for his age and condition.   1. Annual physical exam  - CBC - Comprehensive metabolic panel - Lipid panel - TSH  2. Epididymitis  - doxycycline (VIBRA-TABS) 100 MG tablet; Take 1 tablet (100 mg total) by mouth 2 (two) times daily for 10 days.  Dispense: 20 tablet; Refill: 0 Call if symptoms change or if not rapidly improving.    3. Attention deficit disorder (ADD) without hyperactivity Doing well with current dose of Adderall. He  has slowly lost weight over the last few years. But he reports appetite is good and that he hasn't had time to work out the last few years and has always to work out to keep weight up in the past.     The entirety of the information documented in the History of Present Illness, Review of Systems and Physical Exam were personally obtained by me. Portions of this information were initially documented by the CMA and reviewed by me for thoroughness and accuracy.     Mila Merry, MD  [facility] [phone] (phone) [phone] (fax)  [facility]

## 2021-03-16 NOTE — Patient Instructions (Signed)
.   Please review the attached list of medications and notify my office if there are any errors.   . Please bring all of your medications to every appointment so we can make sure that our medication list is the same as yours.   

## 2021-03-17 LAB — COMPREHENSIVE METABOLIC PANEL
ALT: 6 IU/L (ref 0–44)
AST: 17 IU/L (ref 0–40)
Albumin/Globulin Ratio: 2.4 — ABNORMAL HIGH (ref 1.2–2.2)
Albumin: 4.8 g/dL (ref 4.0–5.0)
Alkaline Phosphatase: 60 IU/L (ref 44–121)
BUN/Creatinine Ratio: 15 (ref 9–20)
BUN: 15 mg/dL (ref 6–20)
Bilirubin Total: 0.3 mg/dL (ref 0.0–1.2)
CO2: 24 mmol/L (ref 20–29)
Calcium: 9.6 mg/dL (ref 8.7–10.2)
Chloride: 101 mmol/L (ref 96–106)
Creatinine, Ser: 1.03 mg/dL (ref 0.76–1.27)
Globulin, Total: 2 g/dL (ref 1.5–4.5)
Glucose: 88 mg/dL (ref 65–99)
Potassium: 4.2 mmol/L (ref 3.5–5.2)
Sodium: 139 mmol/L (ref 134–144)
Total Protein: 6.8 g/dL (ref 6.0–8.5)
eGFR: 99 mL/min/{1.73_m2} (ref >59)

## 2021-03-17 LAB — CBC
Hematocrit: 38.8 % (ref 37.5–51.0)
Hemoglobin: 13.2 g/dL (ref 13.0–17.7)
MCH: 29 pg (ref 26.6–33.0)
MCHC: 34 g/dL (ref 31.5–35.7)
MCV: 85 fL (ref 79–97)
Platelets: 299 10*3/uL (ref 150–450)
RBC: 4.55 x10E6/uL (ref 4.14–5.80)
RDW: 12.3 % (ref 11.6–15.4)
WBC: 8.5 10*3/uL (ref 3.4–10.8)

## 2021-03-17 LAB — LIPID PANEL
Chol/HDL Ratio: 3.1 ratio (ref 0.0–5.0)
Cholesterol, Total: 171 mg/dL (ref 100–199)
HDL: 55 mg/dL (ref >39)
LDL Chol Calc (NIH): 97 mg/dL (ref 0–99)
Triglycerides: 105 mg/dL (ref 0–149)
VLDL Cholesterol Cal: 19 mg/dL (ref 5–40)

## 2021-03-17 LAB — TSH: TSH: 1.23 u[IU]/mL (ref 0.450–4.500)

## 2021-03-25 ENCOUNTER — Other Ambulatory Visit: Payer: Self-pay | Admitting: Family Medicine

## 2021-03-25 DIAGNOSIS — F988 Other specified behavioral and emotional disorders with onset usually occurring in childhood and adolescence: Secondary | ICD-10-CM

## 2021-03-25 MED ORDER — AMPHETAMINE-DEXTROAMPHETAMINE 15 MG PO TABS: 15.0000 mg | ORAL_TABLET | Freq: Two times a day (BID) | ORAL | 0 refills | Status: DC

## 2021-03-25 NOTE — Telephone Encounter (Signed)
LOV: 03/16/2021 NOV: None   Last Refill: 02/25/2021 #60 0 Refills.    Thanks,   -Vernona Rieger

## 2021-04-21 ENCOUNTER — Other Ambulatory Visit: Payer: Self-pay | Admitting: Family Medicine

## 2021-04-21 DIAGNOSIS — F988 Other specified behavioral and emotional disorders with onset usually occurring in childhood and adolescence: Secondary | ICD-10-CM

## 2021-04-21 MED ORDER — AMPHETAMINE-DEXTROAMPHETAMINE 15 MG PO TABS: 15.0000 mg | ORAL_TABLET | Freq: Two times a day (BID) | ORAL | 0 refills | Status: DC

## 2021-04-21 NOTE — Telephone Encounter (Signed)
LOV: 03/16/2021  NOV: None   Last Refill: 03/25/2021  #60 0 Refills

## 2021-05-18 ENCOUNTER — Other Ambulatory Visit: Payer: Self-pay | Admitting: Family Medicine

## 2021-05-18 DIAGNOSIS — F988 Other specified behavioral and emotional disorders with onset usually occurring in childhood and adolescence: Secondary | ICD-10-CM

## 2021-05-19 MED ORDER — AMPHETAMINE-DEXTROAMPHETAMINE 15 MG PO TABS: 15.0000 mg | ORAL_TABLET | Freq: Two times a day (BID) | ORAL | 0 refills | Status: DC

## 2021-06-16 ENCOUNTER — Other Ambulatory Visit: Payer: Self-pay | Admitting: Family Medicine

## 2021-06-16 DIAGNOSIS — F988 Other specified behavioral and emotional disorders with onset usually occurring in childhood and adolescence: Secondary | ICD-10-CM

## 2021-06-16 MED ORDER — AMPHETAMINE-DEXTROAMPHETAMINE 15 MG PO TABS
15.0000 mg | ORAL_TABLET | Freq: Two times a day (BID) | ORAL | 0 refills | Status: DC
Start: 2021-06-16 — End: 2021-07-15

## 2021-07-15 ENCOUNTER — Other Ambulatory Visit: Payer: Self-pay | Admitting: Family Medicine

## 2021-07-15 DIAGNOSIS — F988 Other specified behavioral and emotional disorders with onset usually occurring in childhood and adolescence: Secondary | ICD-10-CM

## 2021-07-16 MED ORDER — AMPHETAMINE-DEXTROAMPHETAMINE 15 MG PO TABS: 15.0000 mg | ORAL_TABLET | Freq: Two times a day (BID) | ORAL | 0 refills | Status: DC

## 2021-08-13 ENCOUNTER — Other Ambulatory Visit: Payer: Self-pay | Admitting: Family Medicine

## 2021-08-13 DIAGNOSIS — F988 Other specified behavioral and emotional disorders with onset usually occurring in childhood and adolescence: Secondary | ICD-10-CM

## 2021-08-14 MED ORDER — AMPHETAMINE-DEXTROAMPHETAMINE 15 MG PO TABS: 15.0000 mg | ORAL_TABLET | Freq: Two times a day (BID) | ORAL | 0 refills | Status: DC

## 2021-09-10 ENCOUNTER — Other Ambulatory Visit: Payer: Self-pay | Admitting: Family Medicine

## 2021-09-10 DIAGNOSIS — F988 Other specified behavioral and emotional disorders with onset usually occurring in childhood and adolescence: Secondary | ICD-10-CM

## 2021-09-11 MED ORDER — AMPHETAMINE-DEXTROAMPHETAMINE 15 MG PO TABS: 15.0000 mg | ORAL_TABLET | Freq: Two times a day (BID) | ORAL | 0 refills | Status: DC

## 2021-10-12 ENCOUNTER — Other Ambulatory Visit: Payer: Self-pay | Admitting: Family Medicine

## 2021-10-12 DIAGNOSIS — F988 Other specified behavioral and emotional disorders with onset usually occurring in childhood and adolescence: Secondary | ICD-10-CM

## 2021-10-13 MED ORDER — AMPHETAMINE-DEXTROAMPHETAMINE 15 MG PO TABS: 15.0000 mg | ORAL_TABLET | Freq: Two times a day (BID) | ORAL | 0 refills | Status: DC

## 2021-11-13 ENCOUNTER — Encounter: Payer: Self-pay | Admitting: Physician Assistant

## 2021-11-13 ENCOUNTER — Ambulatory Visit (INDEPENDENT_AMBULATORY_CARE_PROVIDER_SITE_OTHER): Payer: Commercial Managed Care - PPO | Admitting: Physician Assistant

## 2021-11-13 VITALS — BP 113/76 | HR 112 | Temp 103.1°F | Wt 166.8 lb

## 2021-11-13 DIAGNOSIS — R509 Fever, unspecified: Secondary | ICD-10-CM | POA: Diagnosis not present

## 2021-11-13 LAB — POCT RAPID STREP A (OFFICE): Rapid Strep A Screen: NEGATIVE

## 2021-11-13 MED ORDER — AZITHROMYCIN 500 MG PO TABS: 500.0000 mg | ORAL_TABLET | Freq: Every day | ORAL | 0 refills | Status: DC

## 2021-11-13 NOTE — Progress Notes (Signed)
I,Sulibeya S Dimas,acting as a Neurosurgeon for [employer], PA-C.,have documented all relevant documentation on the behalf of Debera Lat, PA-C,as directed by  [employer], PA-C while in the presence of [employer], PA-C.   Established patient visit   Patient: Patrick Ford   DOB: 1990/09/09   31 y.o. Male  MRN: [id number] Visit Date: 11/13/2021  Today's healthcare provider: Debera Lat, PA-C   CC fever, sore throat  Subjective    HPI  Patient here today C/O fever, chills, scratchy throat. Patient reports he twins boys have been sick one with strep and the other with hand foot and mouth. Both sons received antibiotics and recovered successfully. Patient reports taking ibuprofen 600mg  this morning.   Medications: Outpatient Medications Prior to Visit  Medication Sig   amphetamine-dextroamphetamine (ADDERALL) 15 MG tablet Take 1 tablet by mouth 2 (two) times daily with a meal.   No facility-administered medications prior to visit.    Review of Systems  Constitutional:  Positive for chills and fever.  HENT:  Positive for sore throat.   Respiratory:  Negative for cough.   Musculoskeletal:  Positive for myalgias.       Objective    BP 113/76 (BP Location: Right Arm, Patient Position: Sitting, Cuff Size: Large)   Pulse (!) 112   Temp (!) 103.1 F (39.5 C) (Oral)   Wt 166 lb 12.8 oz (75.7 kg)   SpO2 99%   BMI 19.78 kg/m  BP Readings from Last 3 Encounters:  11/13/21 113/76  03/16/21 115/84  11/21/20 135/88   Wt Readings from Last 3 Encounters:  11/13/21 166 lb 12.8 oz (75.7 kg)  03/16/21 167 lb (75.8 kg)  11/21/20 171 lb 3.2 oz (77.7 kg)      Physical Exam Vitals reviewed.  Constitutional:      General: He is in acute distress.     Appearance: Normal appearance. He is normal weight.  HENT:     Head: Normocephalic and atraumatic.     Right Ear: Tympanic membrane, ear canal and external ear normal.     Left Ear: Tympanic membrane, ear canal and  external ear normal.     Nose: Congestion and rhinorrhea present.     Mouth/Throat:     Pharynx: Posterior oropharyngeal erythema present. No oropharyngeal exudate.  Eyes:     Pupils: Pupils are equal, round, and reactive to light.  Cardiovascular:     Rate and Rhythm: Normal rate and regular rhythm.     Pulses: Normal pulses.     Heart sounds: Normal heart sounds.  Pulmonary:     Effort: Pulmonary effort is normal.     Breath sounds: Normal breath sounds.  Musculoskeletal:     Cervical back: Rigidity (mild) and tenderness present.  Lymphadenopathy:     Cervical: Cervical adenopathy present.  Neurological:     Mental Status: He is alert.    Brudzinski, Kernig negative  No results found for any visits on 11/13/21.  Assessment & Plan     1. Fever, unspecified fever cause Suspected pharyngitis vs hand-and-mouth disease Fever - 103, headache,  - POCT rapid strep A neg - azithromycin (ZITHROMAX) 500 MG tablet; Take 1 tablet (500 mg total) by mouth daily.  Dispense: 10 tablet; Refill: 0 Continue symptomatic treatment Drink a lot of water, liquids - Culture, Group A Strep  The patient was advised to call back or seek an in-person evaluation if the symptoms worsen or if the condition fails to improve as anticipated.  I discussed the assessment and treatment plan with the patient. The patient was provided an opportunity to ask questions and all were answered. The patient agreed with the plan and demonstrated an understanding of the instructions.  The entirety of the information documented in the History of Present Illness, Review of Systems and Physical Exam were personally obtained by me. Portions of this information were initially documented by the CMA and reviewed by me for thoroughness and accuracy.  Portions of this note were created using dictation software and may contain typographical errors.    Debera Lat, PA-C  [facility] [phone]  (phone) [phone] (fax)  [facility]

## 2021-11-16 ENCOUNTER — Other Ambulatory Visit: Payer: Self-pay | Admitting: Family Medicine

## 2021-11-16 DIAGNOSIS — F988 Other specified behavioral and emotional disorders with onset usually occurring in childhood and adolescence: Secondary | ICD-10-CM

## 2021-11-16 LAB — CULTURE, GROUP A STREP: Strep A Culture: NEGATIVE

## 2021-11-16 MED ORDER — AMPHETAMINE-DEXTROAMPHETAMINE 15 MG PO TABS: 15.0000 mg | ORAL_TABLET | Freq: Two times a day (BID) | ORAL | 0 refills | Status: DC

## 2021-12-14 ENCOUNTER — Other Ambulatory Visit: Payer: Self-pay | Admitting: Family Medicine

## 2021-12-14 DIAGNOSIS — F988 Other specified behavioral and emotional disorders with onset usually occurring in childhood and adolescence: Secondary | ICD-10-CM

## 2021-12-14 MED ORDER — AMPHETAMINE-DEXTROAMPHETAMINE 15 MG PO TABS: 15.0000 mg | ORAL_TABLET | Freq: Two times a day (BID) | ORAL | 0 refills | Status: DC

## 2021-12-14 NOTE — Telephone Encounter (Signed)
LOV 03-16-21 NOV none  LF 11-16-21  #60 with 0 refills

## 2022-01-13 ENCOUNTER — Other Ambulatory Visit: Payer: Self-pay | Admitting: Family Medicine

## 2022-01-13 DIAGNOSIS — F988 Other specified behavioral and emotional disorders with onset usually occurring in childhood and adolescence: Secondary | ICD-10-CM

## 2022-01-13 MED ORDER — AMPHETAMINE-DEXTROAMPHETAMINE 15 MG PO TABS: 15.0000 mg | ORAL_TABLET | Freq: Two times a day (BID) | ORAL | 0 refills | Status: DC

## 2022-02-19 ENCOUNTER — Other Ambulatory Visit: Payer: Self-pay | Admitting: Family Medicine

## 2022-02-19 DIAGNOSIS — F988 Other specified behavioral and emotional disorders with onset usually occurring in childhood and adolescence: Secondary | ICD-10-CM

## 2022-02-19 MED ORDER — AMPHETAMINE-DEXTROAMPHETAMINE 15 MG PO TABS: 15.0000 mg | ORAL_TABLET | Freq: Two times a day (BID) | ORAL | 0 refills | Status: DC

## 2022-03-22 ENCOUNTER — Other Ambulatory Visit: Payer: Self-pay | Admitting: Family Medicine

## 2022-03-22 DIAGNOSIS — F988 Other specified behavioral and emotional disorders with onset usually occurring in childhood and adolescence: Secondary | ICD-10-CM

## 2022-03-22 MED ORDER — AMPHETAMINE-DEXTROAMPHETAMINE 15 MG PO TABS: 15.0000 mg | ORAL_TABLET | Freq: Two times a day (BID) | ORAL | 0 refills | Status: DC

## 2022-04-20 ENCOUNTER — Other Ambulatory Visit: Payer: Self-pay | Admitting: Family Medicine

## 2022-04-20 DIAGNOSIS — F988 Other specified behavioral and emotional disorders with onset usually occurring in childhood and adolescence: Secondary | ICD-10-CM

## 2022-04-21 MED ORDER — AMPHETAMINE-DEXTROAMPHETAMINE 15 MG PO TABS: 15.0000 mg | ORAL_TABLET | Freq: Two times a day (BID) | ORAL | 0 refills | Status: DC

## 2022-05-21 ENCOUNTER — Other Ambulatory Visit: Payer: Self-pay | Admitting: Family Medicine

## 2022-05-21 DIAGNOSIS — F988 Other specified behavioral and emotional disorders with onset usually occurring in childhood and adolescence: Secondary | ICD-10-CM

## 2022-05-22 MED ORDER — AMPHETAMINE-DEXTROAMPHETAMINE 15 MG PO TABS: 15.0000 mg | ORAL_TABLET | Freq: Two times a day (BID) | ORAL | 0 refills | Status: DC

## 2022-06-17 ENCOUNTER — Telehealth: Payer: Self-pay | Admitting: Family Medicine

## 2022-06-17 DIAGNOSIS — F988 Other specified behavioral and emotional disorders with onset usually occurring in childhood and adolescence: Secondary | ICD-10-CM

## 2022-06-18 NOTE — Telephone Encounter (Signed)
Needs ov scheduled before refill can be approved

## 2022-06-18 NOTE — Telephone Encounter (Signed)
No answer and no vm. Okay for pec to advise appt. Thanks.

## 2022-06-22 ENCOUNTER — Other Ambulatory Visit: Payer: Self-pay | Admitting: Family Medicine

## 2022-06-22 DIAGNOSIS — F988 Other specified behavioral and emotional disorders with onset usually occurring in childhood and adolescence: Secondary | ICD-10-CM

## 2022-06-22 MED ORDER — AMPHETAMINE-DEXTROAMPHETAMINE 15 MG PO TABS: 15.0000 mg | ORAL_TABLET | Freq: Two times a day (BID) | ORAL | 0 refills | Status: DC

## 2022-07-20 ENCOUNTER — Ambulatory Visit (INDEPENDENT_AMBULATORY_CARE_PROVIDER_SITE_OTHER): Payer: Commercial Managed Care - PPO | Admitting: Family Medicine

## 2022-07-20 ENCOUNTER — Encounter: Payer: Self-pay | Admitting: Family Medicine

## 2022-07-20 VITALS — BP 129/69 | HR 69 | Ht 77.0 in | Wt 174.4 lb

## 2022-07-20 DIAGNOSIS — F988 Other specified behavioral and emotional disorders with onset usually occurring in childhood and adolescence: Secondary | ICD-10-CM

## 2022-07-20 DIAGNOSIS — Z23 Encounter for immunization: Secondary | ICD-10-CM

## 2022-07-20 DIAGNOSIS — Z0000 Encounter for general adult medical examination without abnormal findings: Secondary | ICD-10-CM

## 2022-07-20 DIAGNOSIS — Z1159 Encounter for screening for other viral diseases: Secondary | ICD-10-CM | POA: Diagnosis not present

## 2022-07-20 MED ORDER — AMPHETAMINE-DEXTROAMPHETAMINE 15 MG PO TABS: 15.0000 mg | ORAL_TABLET | Freq: Two times a day (BID) | ORAL | 0 refills | Status: DC

## 2022-07-20 NOTE — Progress Notes (Unsigned)
Complete physical exam   Patient: Patrick Ford   DOB: 1991-05-13   32 y.o. Male  MRN: [id number] Visit Date: 07/20/2022  Today's healthcare provider: Lelon Huh, MD   Chief Complaint  Patient presents with   Annual Exam   Subjective    Patrick Ford is a 32 y.o. male who presents today for a complete physical exam.  He reports consuming a {diet types:17450} diet. {Exercise:19826} He generally feels {well/fairly well/poorly:18703}. He reports sleeping {well/fairly well/poorly:18703}. He {does/does not:200015} have additional problems to discuss today.  HPI  ***  No past medical history on file. Past Surgical History:  Procedure Laterality Date   NO PAST SURGERIES     Social History   Socioeconomic History   Marital status: Married    Spouse name: Not on file   Number of children: Not on file   Years of education: Not on file   Highest education level: Not on file  Occupational History    Comment: Works in beer distribution  Tobacco Use   Smoking status: Never   Smokeless tobacco: Former  Scientific laboratory technician Use: Never used  Substance and Sexual Activity   Alcohol use: Yes    Alcohol/week: 16.0 standard drinks of alcohol    Types: 16 Cans of beer per week    Comment: usually 2 beers a night and 5-6 on weekends.    Drug use: No   Sexual activity: Yes  Other Topics Concern   Not on file  Social History Narrative   Not on file   Social Determinants of Health   Financial Resource Strain: Not on file  Food Insecurity: Not on file  Transportation Needs: Not on file  Physical Activity: Not on file  Stress: Not on file  Social Connections: Not on file  Intimate Partner Violence: Not on file   Family Status  Relation Name Status   Mother  Alive   Father  Alive   Sister  Alive   Sister  Alive   Brother  Alive   MGF  Deceased   Neg Hx  (Not Specified)   Family History  Problem Relation Age of Onset   Healthy Mother    Healthy Sister     Healthy Sister    Healthy Brother    Heart disease Maternal Grandfather    Colon cancer Neg Hx    Prostate cancer Neg Hx    No Known Allergies  Patient Care Team: Birdie Sons, MD as PCP - General (Family Medicine)   Medications: Outpatient Medications Prior to Visit  Medication Sig   [DISCONTINUED] amphetamine-dextroamphetamine (ADDERALL) 15 MG tablet Take 1 tablet by mouth 2 (two) times daily with a meal.   [DISCONTINUED] azithromycin (ZITHROMAX) 500 MG tablet Take 1 tablet (500 mg total) by mouth daily.   No facility-administered medications prior to visit.    Review of Systems  {Labs  Heme  Chem  Endocrine  Serology  Results Review (optional):23779}  Objective    BP 129/69   Pulse 69   Ht 6\' 5"  (1.956 m)   Wt 174 lb 6.4 oz (79.1 kg)   SpO2 100%   BMI 20.68 kg/m  {Show previous vital signs (optional):23777}   Physical Exam  ***  Last depression screening scores    07/20/2022    9:10 AM 11/21/2020    3:09 PM 10/16/2019    3:11 PM  PHQ 2/9 Scores  PHQ - 2 Score 0 0 0  PHQ- 9 Score  1 2    Last fall risk screening    07/20/2022    9:10 AM  [place] in the past year? 0  Number falls in past yr: 0  Injury with Fall? 0  Risk for fall due to : No Fall Risks  Follow up Falls evaluation completed   Last Audit-C alcohol use screening     No data to display         A score of 3 or more in women, and 4 or more in men indicates increased risk for alcohol abuse, EXCEPT if all of the points are from question 1   No results found for any visits on 07/20/22.  Assessment & Plan    Routine Health Maintenance and Physical Exam  Exercise Activities and Dietary recommendations  Goals   None     Immunization History  Administered Date(s) Administered   Tdap 10/29/2011    Health Maintenance  Topic Date Due   COVID-19 Vaccine (1) Never done   HIV Screening  Never done   Hepatitis C Screening  Never done   DTaP/Tdap/Td (2 - Td or Tdap)  10/28/2021   INFLUENZA VACCINE  09/26/2022 (Originally 01/26/2022)   HPV VACCINES  Aged Out    Discussed health benefits of physical activity, and encouraged him to engage in regular exercise appropriate for his age and condition.  ***  No follow-ups on file.     {provider attestation***:1}   Lelon Huh, MD  [place] [phone] (phone) [phone] (fax)  [place]

## 2022-07-20 NOTE — Patient Instructions (Signed)
Please review the attached list of medications and notify my office if there are any errors.   Avoid saturated fat in your diet as much as possible

## 2022-08-17 ENCOUNTER — Other Ambulatory Visit: Payer: Self-pay | Admitting: Family Medicine

## 2022-08-17 DIAGNOSIS — F988 Other specified behavioral and emotional disorders with onset usually occurring in childhood and adolescence: Secondary | ICD-10-CM

## 2022-08-17 MED ORDER — AMPHETAMINE-DEXTROAMPHETAMINE 15 MG PO TABS: 15.0000 mg | ORAL_TABLET | Freq: Two times a day (BID) | ORAL | 0 refills | Status: DC

## 2022-09-14 ENCOUNTER — Other Ambulatory Visit: Payer: Self-pay | Admitting: Family Medicine

## 2022-09-14 DIAGNOSIS — F988 Other specified behavioral and emotional disorders with onset usually occurring in childhood and adolescence: Secondary | ICD-10-CM

## 2022-09-15 MED ORDER — AMPHETAMINE-DEXTROAMPHETAMINE 15 MG PO TABS: 15.0000 mg | ORAL_TABLET | Freq: Two times a day (BID) | ORAL | 0 refills | Status: DC

## 2022-10-19 ENCOUNTER — Other Ambulatory Visit: Payer: Self-pay | Admitting: Family Medicine

## 2022-10-19 DIAGNOSIS — F988 Other specified behavioral and emotional disorders with onset usually occurring in childhood and adolescence: Secondary | ICD-10-CM

## 2022-10-20 MED ORDER — AMPHETAMINE-DEXTROAMPHETAMINE 15 MG PO TABS: 15.0000 mg | ORAL_TABLET | Freq: Two times a day (BID) | ORAL | 0 refills | Status: DC

## 2022-11-17 ENCOUNTER — Other Ambulatory Visit: Payer: Self-pay | Admitting: Family Medicine

## 2022-11-17 DIAGNOSIS — F988 Other specified behavioral and emotional disorders with onset usually occurring in childhood and adolescence: Secondary | ICD-10-CM

## 2022-11-17 MED ORDER — AMPHETAMINE-DEXTROAMPHETAMINE 15 MG PO TABS: 15.0000 mg | ORAL_TABLET | Freq: Two times a day (BID) | ORAL | 0 refills | Status: DC

## 2022-12-16 ENCOUNTER — Other Ambulatory Visit: Payer: Self-pay | Admitting: Family Medicine

## 2022-12-16 DIAGNOSIS — F988 Other specified behavioral and emotional disorders with onset usually occurring in childhood and adolescence: Secondary | ICD-10-CM

## 2022-12-16 MED ORDER — AMPHETAMINE-DEXTROAMPHETAMINE 15 MG PO TABS: 15.0000 mg | ORAL_TABLET | Freq: Two times a day (BID) | ORAL | 0 refills | Status: DC

## 2023-01-14 ENCOUNTER — Other Ambulatory Visit: Payer: Self-pay | Admitting: Family Medicine

## 2023-01-14 DIAGNOSIS — F988 Other specified behavioral and emotional disorders with onset usually occurring in childhood and adolescence: Secondary | ICD-10-CM

## 2023-01-14 MED ORDER — AMPHETAMINE-DEXTROAMPHETAMINE 15 MG PO TABS: 15.0000 mg | ORAL_TABLET | Freq: Two times a day (BID) | ORAL | 0 refills | Status: DC

## 2023-02-17 ENCOUNTER — Other Ambulatory Visit: Payer: Self-pay | Admitting: Family Medicine

## 2023-02-17 DIAGNOSIS — F988 Other specified behavioral and emotional disorders with onset usually occurring in childhood and adolescence: Secondary | ICD-10-CM

## 2023-02-18 MED ORDER — AMPHETAMINE-DEXTROAMPHETAMINE 15 MG PO TABS: 15.0000 mg | ORAL_TABLET | Freq: Two times a day (BID) | ORAL | 0 refills | Status: DC

## 2023-03-18 ENCOUNTER — Other Ambulatory Visit: Payer: Self-pay | Admitting: Family Medicine

## 2023-03-18 DIAGNOSIS — F988 Other specified behavioral and emotional disorders with onset usually occurring in childhood and adolescence: Secondary | ICD-10-CM

## 2023-03-21 ENCOUNTER — Other Ambulatory Visit: Payer: Self-pay | Admitting: Family Medicine

## 2023-03-21 DIAGNOSIS — F988 Other specified behavioral and emotional disorders with onset usually occurring in childhood and adolescence: Secondary | ICD-10-CM

## 2023-03-22 MED ORDER — AMPHETAMINE-DEXTROAMPHETAMINE 15 MG PO TABS: 15.0000 mg | ORAL_TABLET | Freq: Two times a day (BID) | ORAL | 0 refills | Status: DC

## 2023-04-19 ENCOUNTER — Other Ambulatory Visit: Payer: Self-pay | Admitting: Family Medicine

## 2023-04-19 DIAGNOSIS — F988 Other specified behavioral and emotional disorders with onset usually occurring in childhood and adolescence: Secondary | ICD-10-CM

## 2023-04-20 MED ORDER — AMPHETAMINE-DEXTROAMPHETAMINE 15 MG PO TABS: 15.0000 mg | ORAL_TABLET | Freq: Two times a day (BID) | ORAL | 0 refills | Status: DC

## 2023-05-18 ENCOUNTER — Other Ambulatory Visit: Payer: Self-pay | Admitting: Family Medicine

## 2023-05-18 DIAGNOSIS — F988 Other specified behavioral and emotional disorders with onset usually occurring in childhood and adolescence: Secondary | ICD-10-CM

## 2023-05-18 MED ORDER — AMPHETAMINE-DEXTROAMPHETAMINE 15 MG PO TABS: 15.0000 mg | ORAL_TABLET | Freq: Two times a day (BID) | ORAL | 0 refills | Status: DC

## 2023-06-16 ENCOUNTER — Other Ambulatory Visit: Payer: Self-pay | Admitting: Family Medicine

## 2023-06-16 DIAGNOSIS — F988 Other specified behavioral and emotional disorders with onset usually occurring in childhood and adolescence: Secondary | ICD-10-CM

## 2023-06-16 MED ORDER — AMPHETAMINE-DEXTROAMPHETAMINE 15 MG PO TABS: 15.0000 mg | ORAL_TABLET | Freq: Two times a day (BID) | ORAL | 0 refills | Status: DC

## 2023-07-19 ENCOUNTER — Other Ambulatory Visit: Payer: Self-pay | Admitting: Family Medicine

## 2023-07-19 DIAGNOSIS — F988 Other specified behavioral and emotional disorders with onset usually occurring in childhood and adolescence: Secondary | ICD-10-CM

## 2023-07-19 MED ORDER — AMPHETAMINE-DEXTROAMPHETAMINE 15 MG PO TABS: 15.0000 mg | ORAL_TABLET | Freq: Two times a day (BID) | ORAL | 0 refills | Status: DC

## 2023-08-18 ENCOUNTER — Other Ambulatory Visit: Payer: Self-pay | Admitting: Family Medicine

## 2023-08-18 DIAGNOSIS — F988 Other specified behavioral and emotional disorders with onset usually occurring in childhood and adolescence: Secondary | ICD-10-CM

## 2023-08-19 ENCOUNTER — Encounter: Payer: Self-pay | Admitting: Family Medicine

## 2023-08-19 MED ORDER — AMPHETAMINE-DEXTROAMPHETAMINE 15 MG PO TABS: 15.0000 mg | ORAL_TABLET | Freq: Two times a day (BID) | ORAL | 0 refills | Status: DC

## 2023-09-15 ENCOUNTER — Other Ambulatory Visit: Payer: Self-pay | Admitting: Family Medicine

## 2023-09-15 DIAGNOSIS — F988 Other specified behavioral and emotional disorders with onset usually occurring in childhood and adolescence: Secondary | ICD-10-CM

## 2023-09-15 MED ORDER — AMPHETAMINE-DEXTROAMPHETAMINE 15 MG PO TABS: 15.0000 mg | ORAL_TABLET | Freq: Two times a day (BID) | ORAL | 0 refills | Status: DC

## 2023-10-17 ENCOUNTER — Other Ambulatory Visit: Payer: Self-pay | Admitting: Family Medicine

## 2023-10-17 DIAGNOSIS — F988 Other specified behavioral and emotional disorders with onset usually occurring in childhood and adolescence: Secondary | ICD-10-CM

## 2023-10-18 MED ORDER — AMPHETAMINE-DEXTROAMPHETAMINE 15 MG PO TABS: 15.0000 mg | ORAL_TABLET | Freq: Two times a day (BID) | ORAL | 0 refills | Status: DC

## 2023-10-18 NOTE — Telephone Encounter (Signed)
 This is a courtesy refill.  Patient needs to schedule a follow-up appointment in the office before the next refill

## 2023-11-16 ENCOUNTER — Other Ambulatory Visit: Payer: Self-pay | Admitting: Family Medicine

## 2023-11-16 DIAGNOSIS — F988 Other specified behavioral and emotional disorders with onset usually occurring in childhood and adolescence: Secondary | ICD-10-CM

## 2023-11-16 NOTE — Telephone Encounter (Signed)
 Requested medication (s) are due for refill today - yes  Requested medication (s) are on the active medication list -yes  Future visit scheduled -yes  Last refill: 10/18/23 #60   Notes to clinic: non delegated Rx  Requested Prescriptions  Pending Prescriptions Disp Refills   amphetamine -dextroamphetamine  (ADDERALL) 15 MG tablet 60 tablet 0    Sig: Take 1 tablet by mouth 2 (two) times daily with a meal.     Not Delegated - Psychiatry:  Stimulants/ADHD Failed - 11/16/2023  3:54 PM      Failed - This refill cannot be delegated      Failed - Urine Drug Screen completed in last 360 days      Failed - Valid encounter within last 6 months    Recent Outpatient Visits   None            Passed - Last BP in normal range    BP Readings from Last 1 Encounters:  07/20/22 129/69         Passed - Last Heart Rate in normal range    Pulse Readings from Last 1 Encounters:  07/20/22 69            Requested Prescriptions  Pending Prescriptions Disp Refills   amphetamine -dextroamphetamine  (ADDERALL) 15 MG tablet 60 tablet 0    Sig: Take 1 tablet by mouth 2 (two) times daily with a meal.     Not Delegated - Psychiatry:  Stimulants/ADHD Failed - 11/16/2023  3:54 PM      Failed - This refill cannot be delegated      Failed - Urine Drug Screen completed in last 360 days      Failed - Valid encounter within last 6 months    Recent Outpatient Visits   None            Passed - Last BP in normal range    BP Readings from Last 1 Encounters:  07/20/22 129/69         Passed - Last Heart Rate in normal range    Pulse Readings from Last 1 Encounters:  07/20/22 69

## 2023-11-16 NOTE — Telephone Encounter (Signed)
 Copied from CRM [id number]. Topic: Clinical - Medication Refill >> Nov 16, 2023  9:51 AM Antwanette L wrote: Medication: amphetamine -dextroamphetamine  (ADDERALL) 15 MG tablet  Has the patient contacted their pharmacy? No  This is the patient's preferred pharmacy:  [id number] [place], [place] - [address] [place] [address] Phone: [phone] Fax: [phone]  Is this the correct pharmacy for this prescription? Yes   Has the prescription been filled recently? Yes. Last refilled on 10/18/23  Is the patient out of the medication? No. Patient has 2 days left  Has the patient been seen for an appointment in the last year OR does the patient have an upcoming appointment? Yes. Patient has an upcoming appt on 11/30/23  Can we respond through MyChart? No.Contact patient by phone at [phone]  Agent: Please be advised that Rx refills may take up to 3 business days. We ask that you follow-up with your pharmacy.

## 2023-11-17 MED ORDER — AMPHETAMINE-DEXTROAMPHETAMINE 15 MG PO TABS: 15.0000 mg | ORAL_TABLET | Freq: Two times a day (BID) | ORAL | 0 refills | Status: DC

## 2023-11-25 ENCOUNTER — Encounter: Payer: Self-pay | Admitting: Family Medicine

## 2023-11-25 DIAGNOSIS — F988 Other specified behavioral and emotional disorders with onset usually occurring in childhood and adolescence: Secondary | ICD-10-CM

## 2023-11-28 MED ORDER — AMPHETAMINE-DEXTROAMPHETAMINE 15 MG PO TABS: 15.0000 mg | ORAL_TABLET | Freq: Two times a day (BID) | ORAL | 0 refills | Status: DC

## 2023-11-30 ENCOUNTER — Encounter: Payer: Self-pay | Admitting: Family Medicine

## 2023-11-30 ENCOUNTER — Ambulatory Visit (INDEPENDENT_AMBULATORY_CARE_PROVIDER_SITE_OTHER): Admitting: Family Medicine

## 2023-11-30 VITALS — BP 138/81 | HR 98 | Temp 99.1°F | Resp 16 | Ht 77.0 in | Wt 173.3 lb

## 2023-11-30 DIAGNOSIS — Z0000 Encounter for general adult medical examination without abnormal findings: Secondary | ICD-10-CM

## 2023-11-30 DIAGNOSIS — F900 Attention-deficit hyperactivity disorder, predominantly inattentive type: Secondary | ICD-10-CM | POA: Diagnosis not present

## 2023-11-30 NOTE — Progress Notes (Signed)
 Complete physical exam   Patient: Patrick Ford   DOB: 03-29-1991   33 y.o. Male  MRN: [id number] Visit Date: 11/30/2023  Today's healthcare provider: Jeralene Mom, MD   Chief Complaint  Patient presents with   Annual Exam    Patient overall feeling well.   Subjective    Discussed the use of AI scribe software for clinical note transcription with the patient, who gave verbal consent to proceed.  History of Present Illness   Patrick Ford is a 33 year old male who presents for a routine annual physical exam.  He has no new medical problems since his last visit and has been staying healthy. No chest pain, heart flutter, shortness of breath, vision problems, stomach issues, or changes in bowel habits.  He is currently taking Adderall, which is effective for focus at work. He takes it daily, one dose in the morning and a second dose about four hours later. No trouble sleeping, heart racing, or changes in appetite due to the medication.  He is physically active, being on his feet most of the day and keeping up with his twin boys who just turned four years old.  He consumes alcohol, typically having 1-3 beers a day, with a maximum of four, usually on weekends or during social gatherings. He does not smoke.  His last tetanus shot was administered last year, and his blood work from a couple of years ago was normal.     Lab Results  Component Value Date   CHOL 171 03/16/2021   HDL 55 03/16/2021   LDLCALC 97 03/16/2021   TRIG 105 03/16/2021   CHOLHDL 3.1 03/16/2021   Lab Results  Component Value Date   NA 139 03/16/2021   CL 101 03/16/2021   K 4.2 03/16/2021   CO2 24 03/16/2021   BUN 15 03/16/2021   CREATININE 1.03 03/16/2021   EGFR 99 03/16/2021   CALCIUM 9.6 03/16/2021   ALBUMIN 4.8 03/16/2021   GLUCOSE 88 03/16/2021   Wt Readings from Last 3 Encounters:  11/30/23 173 lb 4.8 oz (78.6 kg)  07/20/22 174 lb 6.4 oz (79.1 kg)  11/13/21 166 lb 12.8 oz (75.7 kg)      History reviewed. No pertinent past medical history. Past Surgical History:  Procedure Laterality Date   NO PAST SURGERIES     Social History   Socioeconomic History   Marital status: Married    Spouse name: Not on file   Number of children: Not on file   Years of education: Not on file   Highest education level: Not on file  Occupational History    Comment: Works in beer distribution  Tobacco Use   Smoking status: Never   Smokeless tobacco: Former  Building services engineer status: Never Used  Substance and Sexual Activity   Alcohol use: Yes    Alcohol/week: 16.0 standard drinks of alcohol    Types: 16 Cans of beer per week    Comment: usually 2 beers a night and 5-6 on weekends.    Drug use: No   Sexual activity: Yes  Other Topics Concern   Not on file  Social History Narrative   Not on file   Social Drivers of Health   Financial Resource Strain: Not on file  Food Insecurity: Not on file  Transportation Needs: Not on file  Physical Activity: Not on file  Stress: Not on file  Social Connections: Not on file  Intimate Partner Violence: Not on file  Family Status  Relation Name Status   Mother  Alive   Father  Alive   Sister  Alive   Sister  Alive   Brother  Alive   MGF  Deceased   Neg Hx  (Not Specified)  No partnership data on file   Family History  Problem Relation Age of Onset   Healthy Mother    Healthy Sister    Healthy Sister    Healthy Brother    Heart disease Maternal Grandfather    Colon cancer Neg Hx    Prostate cancer Neg Hx    No Known Allergies  Patient Care Team: Lamon Pillow, MD as PCP - General (Family Medicine)   Medications: Outpatient Medications Prior to Visit  Medication Sig   amphetamine -dextroamphetamine  (ADDERALL) 15 MG tablet Take 1 tablet by mouth 2 (two) times daily with a meal.   No facility-administered medications prior to visit.    Review of Systems  Constitutional:  Negative for chills, diaphoresis and  fever.  HENT:  Negative for congestion, ear discharge, ear pain, hearing loss, nosebleeds, sore throat and tinnitus.   Eyes:  Negative for photophobia, pain, discharge and redness.  Respiratory:  Negative for cough, shortness of breath, wheezing and stridor.   Cardiovascular:  Negative for chest pain, palpitations and leg swelling.  Gastrointestinal:  Negative for abdominal pain, blood in stool, constipation, diarrhea, nausea and vomiting.  Endocrine: Negative for polydipsia.  Genitourinary:  Negative for dysuria, flank pain, frequency, hematuria and urgency.  Musculoskeletal:  Negative for back pain, myalgias and neck pain.  Skin:  Negative for rash.  Allergic/Immunologic: Negative for environmental allergies.  Neurological:  Negative for dizziness, tremors, seizures, weakness and headaches.  Hematological:  Does not bruise/bleed easily.  Psychiatric/Behavioral:  Negative for hallucinations and suicidal ideas. The patient is not nervous/anxious.       Objective    BP 138/81 (BP Location: Left Arm, Patient Position: Sitting, Cuff Size: Normal)   Pulse 98   Temp 99.1 F (37.3 C) (Oral)   Resp 16   Ht 6\' 5"  (1.956 m)   Wt 173 lb 4.8 oz (78.6 kg)   SpO2 99%   BMI 20.55 kg/m    Physical Exam   General Appearance:    Well developed, well nourished male. Alert, cooperative, in no acute distress, appears stated age  Head:    Normocephalic, without obvious abnormality, atraumatic  Eyes:    PERRL, conjunctiva/corneas clear, EOM's intact, fundi    benign, both eyes       Ears:    Normal TM's and external ear canals, both ears  Nose:   Nares normal, septum midline, mucosa normal, no drainage   or sinus tenderness  Throat:   Lips, mucosa, and tongue normal; teeth and gums normal  Neck:   Supple, symmetrical, trachea midline, no adenopathy;       thyroid:  No enlargement/tenderness/nodules; no carotid   bruit or JVD  Back:     Symmetric, no curvature, ROM normal, no CVA tenderness   Lungs:     Clear to auscultation bilaterally, respirations unlabored  Chest wall:    No tenderness or deformity  Heart:    Normal heart rate. Normal rhythm. No murmurs, rubs, or gallops.  S1 and S2 normal  Abdomen:     Soft, non-tender, bowel sounds active all four quadrants,    no masses, no organomegaly  Genitalia:    deferred  Rectal:    deferred  Extremities:   All extremities are  intact. No cyanosis or edema  Pulses:   2+ and symmetric all extremities  Skin:   Skin color, texture, turgor normal, no rashes or lesions  Lymph nodes:   Cervical, supraclavicular, and axillary nodes normal  Neurologic:   CNII-XII intact. Normal strength, sensation and reflexes      throughout     Last depression screening scores    11/30/2023    4:15 PM 07/20/2022    9:10 AM 11/21/2020    3:09 PM  PHQ 2/9 Scores  PHQ - 2 Score 0 0 0  PHQ- 9 Score  1 2   Last fall risk screening    11/30/2023    4:17 PM  Fall Risk   Falls in the past year? 0  Number falls in past yr: 0  Injury with Fall? 0  Risk for fall due to : No Fall Risks   Last Audit-C alcohol use screening     No data to display         A score of 3 or more in women, and 4 or more in men indicates increased risk for alcohol abuse, EXCEPT if all of the points are from question 1   No results found for any visits on 11/30/23.  Assessment & Plan    Routine Health Maintenance and Physical Exam  Exercise Activities and Dietary recommendations  Goals   None     Immunization History  Administered Date(s) Administered   Tdap 10/29/2011, 07/20/2022    Health Maintenance  Topic Date Due   HIV Screening  Never done   Hepatitis C Screening  Never done   INFLUENZA VACCINE  01/27/2024   DTaP/Tdap/Td (3 - Td or Tdap) 07/20/2032   HPV VACCINES  Aged Out   Meningococcal B Vaccine  Aged Out   COVID-19 Vaccine  Discontinued    Discussed health benefits of physical activity, and encouraged him to engage in regular exercise  appropriate for his age and condition.   2. Attention deficit hyperactivity disorder (ADHD), predominantly inattentive type Doing well current dose of Adderall.    Return in about 1 year (around 11/29/2024).        Jeralene Mom, MD  [facility] [phone] (phone) [phone] (fax)  [facility]

## 2023-11-30 NOTE — Patient Instructions (Signed)
 Patrick Ford  Please review the attached list of medications and notify my office if there are any errors.   . Please bring all of your medications to every appointment so we can make sure that our medication list is the same as yours.

## 2023-12-26 ENCOUNTER — Other Ambulatory Visit: Payer: Self-pay | Admitting: Family Medicine

## 2023-12-26 DIAGNOSIS — F988 Other specified behavioral and emotional disorders with onset usually occurring in childhood and adolescence: Secondary | ICD-10-CM

## 2023-12-26 NOTE — Telephone Encounter (Unsigned)
 Copied from CRM [id number]. Topic: Clinical - Medication Refill >> Dec 26, 2023  9:49 AM Jalayah J wrote: Medication: amphetamine -dextroamphetamine  (ADDERALL) 15 MG tablet  Has the patient contacted their pharmacy? Yes (Agent: If no, request that the patient contact the pharmacy for the refill. If patient does not wish to contact the pharmacy document the reason why and proceed with request.) (Agent: If yes, when and what did the pharmacy advise?)  This is the patient's preferred pharmacy:  [id number] - [place], [place] - [address] [place] [address] Phone: [phone] Fax: [phone]  Is this the correct pharmacy for this prescription? Yes If no, delete pharmacy and type the correct one.   Has the prescription been filled recently? Yes  Is the patient out of the medication? No  Has the patient been seen for an appointment in the last year OR does the patient have an upcoming appointment? Yes  Can we respond through MyChart? Yes  Agent: Please be advised that Rx refills may take up to 3 business days. We ask that you follow-up with your pharmacy.

## 2023-12-27 ENCOUNTER — Other Ambulatory Visit: Payer: Self-pay

## 2023-12-27 DIAGNOSIS — F988 Other specified behavioral and emotional disorders with onset usually occurring in childhood and adolescence: Secondary | ICD-10-CM

## 2023-12-27 MED ORDER — AMPHETAMINE-DEXTROAMPHETAMINE 15 MG PO TABS: 15.0000 mg | ORAL_TABLET | Freq: Two times a day (BID) | ORAL | 0 refills | Status: DC

## 2023-12-27 NOTE — Telephone Encounter (Signed)
 Requested medications are due for refill today.  yes  Requested medications are on the active medications list.  yes  Last refill. 11/28/2023 #60 0 rf  Future visit scheduled.   yes  Notes to clinic.  Refill not delegated.    Requested Prescriptions  Pending Prescriptions Disp Refills   amphetamine -dextroamphetamine  (ADDERALL) 15 MG tablet 60 tablet 0    Sig: Take 1 tablet by mouth 2 (two) times daily with a meal.     Not Delegated - Psychiatry:  Stimulants/ADHD Failed - 12/27/2023  5:55 PM      Failed - This refill cannot be delegated      Failed - Urine Drug Screen completed in last 360 days      Failed - Valid encounter within last 6 months    Recent Outpatient Visits           3 weeks ago Annual physical exam   [place] [facility] Gasper Nancyann BRAVO, MD       Future Appointments             In 11 months Gasper, Nancyann BRAVO, MD [facility]            Passed - Last BP in normal range    BP Readings from Last 1 Encounters:  11/30/23 138/81         Passed - Last Heart Rate in normal range    Pulse Readings from Last 1 Encounters:  11/30/23 98

## 2024-01-25 ENCOUNTER — Telehealth: Payer: Self-pay | Admitting: Family Medicine

## 2024-01-25 DIAGNOSIS — F988 Other specified behavioral and emotional disorders with onset usually occurring in childhood and adolescence: Secondary | ICD-10-CM

## 2024-01-25 NOTE — Telephone Encounter (Unsigned)
 Copied from CRM [id number]. Topic: Clinical - Medication Refill >> Jan 25, 2024 10:46 AM Willma R wrote: Medication: amphetamine -dextroamphetamine  (ADDERALL) 15 MG tablet  Has the patient contacted their pharmacy? Yes, call dr  This is the patient's preferred pharmacy:  [id number] [place], [place] - [address] [place] [address] Phone: [phone] Fax: [phone]  Is this the correct pharmacy for this prescription? Yes If no, delete pharmacy and type the correct one.   Has the prescription been filled recently? No  Is the patient out of the medication? No  Has the patient been seen for an appointment in the last year OR does the patient have an upcoming appointment? Yes  Can we respond through MyChart? Yes  Agent: Please be advised that Rx refills may take up to 3 business days. We ask that you follow-up with your pharmacy.

## 2024-01-26 NOTE — Telephone Encounter (Signed)
 Requested medications are due for refill today.  yes  Requested medications are on the active medications list.  yes  Last refill. 12/27/2023 #60 0 rf  Future visit scheduled.   yes  Notes to clinic.  Refill not delegated.    Requested Prescriptions  Pending Prescriptions Disp Refills   amphetamine -dextroamphetamine  (ADDERALL) 15 MG tablet 60 tablet 0    Sig: Take 1 tablet by mouth 2 (two) times daily with a meal.     Not Delegated - Psychiatry:  Stimulants/ADHD Failed - 01/26/2024 11:14 AM      Failed - This refill cannot be delegated      Failed - Urine Drug Screen completed in last 360 days      Passed - Last BP in normal range    BP Readings from Last 1 Encounters:  11/30/23 138/81         Passed - Last Heart Rate in normal range    Pulse Readings from Last 1 Encounters:  11/30/23 98         Passed - Valid encounter within last 6 months    Recent Outpatient Visits           1 month ago Annual physical exam   [facility] Gasper Nancyann BRAVO, MD       Future Appointments             In 10 months Fisher, Nancyann BRAVO, MD [facility]

## 2024-01-27 MED ORDER — AMPHETAMINE-DEXTROAMPHETAMINE 15 MG PO TABS
15.0000 mg | ORAL_TABLET | Freq: Two times a day (BID) | ORAL | 0 refills | Status: DC
Start: 2024-01-27 — End: 2024-02-24

## 2024-02-24 ENCOUNTER — Other Ambulatory Visit: Payer: Self-pay | Admitting: Family Medicine

## 2024-02-24 DIAGNOSIS — F988 Other specified behavioral and emotional disorders with onset usually occurring in childhood and adolescence: Secondary | ICD-10-CM

## 2024-02-24 NOTE — Telephone Encounter (Signed)
 Copied from CRM [id number]. Topic: Clinical - Medication Refill >> Feb 24, 2024 11:01 AM Myrick T wrote: Medication: amphetamine -dextroamphetamine  (ADDERALL) 15 MG tablet  Has the patient contacted their pharmacy? No This is the patient's preferred pharmacy:  [id number] [place], [place] - [address] [place] [address] Phone: [phone] Fax: [phone]  Is this the correct pharmacy for this prescription? Yes  Has the prescription been filled recently? Yes  Is the patient out of the medication? No  Has the patient been seen for an appointment in the last year OR does the patient have an upcoming appointment? Yes  Can we respond through MyChart? Yes  Agent: Please be advised that Rx refills may take up to 3 business days. We ask that you follow-up with your pharmacy.

## 2024-02-26 NOTE — Telephone Encounter (Signed)
 Requested medication (s) are due for refill today: Yes  Requested medication (s) are on the active medication list: Yes  Last refill:  01/27/24  Future visit scheduled: Yes  Notes to clinic:  Unable to refill per protocol, cannot delegate.      Requested Prescriptions  Pending Prescriptions Disp Refills   amphetamine -dextroamphetamine  (ADDERALL) 15 MG tablet 60 tablet 0    Sig: Take 1 tablet by mouth 2 (two) times daily with a meal.     Not Delegated - Psychiatry:  Stimulants/ADHD Failed - 02/26/2024 10:21 PM      Failed - This refill cannot be delegated      Failed - Urine Drug Screen completed in last 360 days      Passed - Last BP in normal range    BP Readings from Last 1 Encounters:  11/30/23 138/81         Passed - Last Heart Rate in normal range    Pulse Readings from Last 1 Encounters:  11/30/23 98         Passed - Valid encounter within last 6 months    Recent Outpatient Visits           2 months ago Annual physical exam   [facility] Gasper Nancyann BRAVO, MD       Future Appointments             In 9 months Fisher, Nancyann BRAVO, MD [facility], [place]

## 2024-02-28 MED ORDER — AMPHETAMINE-DEXTROAMPHETAMINE 15 MG PO TABS
15.0000 mg | ORAL_TABLET | Freq: Two times a day (BID) | ORAL | 0 refills | Status: DC
Start: 2024-02-28 — End: 2024-03-26

## 2024-03-26 ENCOUNTER — Other Ambulatory Visit: Payer: Self-pay | Admitting: Family Medicine

## 2024-03-26 DIAGNOSIS — F988 Other specified behavioral and emotional disorders with onset usually occurring in childhood and adolescence: Secondary | ICD-10-CM

## 2024-03-26 NOTE — Telephone Encounter (Unsigned)
 Copied from CRM [id number]. Topic: Clinical - Medication Refill >> Mar 26, 2024  9:32 AM Debby BROCKS wrote: Medication: amphetamine -dextroamphetamine  (ADDERALL) 15 MG tablet  Has the patient contacted their pharmacy? Yes (Agent: If no, request that the patient contact the pharmacy for the refill. If patient does not wish to contact the pharmacy document the reason why and proceed with request.) (Agent: If yes, when and what did the pharmacy advise?)  This is the patient's preferred pharmacy:  [id number] - [place], [place] - [address] [place] [address] Phone: [phone] Fax: [phone]  Is this the correct pharmacy for this prescription? Yes If no, delete pharmacy and type the correct one.   Has the prescription been filled recently? No  Is the patient out of the medication? No, has 1 or 2 days left  Has the patient been seen for an appointment in the last year OR does the patient have an upcoming appointment? Yes  Can we respond through MyChart? Yes  Agent: Please be advised that Rx refills may take up to 3 business days. We ask that you follow-up with your pharmacy.

## 2024-03-27 NOTE — Telephone Encounter (Signed)
 Requested medications are due for refill today.  yes  Requested medications are on the active medications list.  yes  Last refill. 02/28/2024 #60 0 rf  Future visit scheduled.   yes  Notes to clinic.  Refill not delegated.    Requested Prescriptions  Pending Prescriptions Disp Refills   amphetamine -dextroamphetamine  (ADDERALL) 15 MG tablet 60 tablet 0    Sig: Take 1 tablet by mouth 2 (two) times daily with a meal.     Not Delegated - Psychiatry:  Stimulants/ADHD Failed - 03/27/2024  5:46 PM      Failed - This refill cannot be delegated      Failed - Urine Drug Screen completed in last 360 days      Passed - Last BP in normal range    BP Readings from Last 1 Encounters:  11/30/23 138/81         Passed - Last Heart Rate in normal range    Pulse Readings from Last 1 Encounters:  11/30/23 98         Passed - Valid encounter within last 6 months    Recent Outpatient Visits           3 months ago Annual physical exam   [facility] Gasper Nancyann BRAVO, MD       Future Appointments             In 8 months Fisher, Nancyann BRAVO, MD [facility] [place], [place]

## 2024-03-29 MED ORDER — AMPHETAMINE-DEXTROAMPHETAMINE 15 MG PO TABS: 15.0000 mg | ORAL_TABLET | Freq: Two times a day (BID) | ORAL | 0 refills | Status: DC

## 2024-04-25 ENCOUNTER — Other Ambulatory Visit: Payer: Self-pay | Admitting: Family Medicine

## 2024-04-25 DIAGNOSIS — F988 Other specified behavioral and emotional disorders with onset usually occurring in childhood and adolescence: Secondary | ICD-10-CM

## 2024-04-25 NOTE — Telephone Encounter (Signed)
 Copied from CRM [id number]. Topic: Clinical - Medication Refill >> Apr 25, 2024  1:27 PM Nessti S wrote: Medication: amphetamine -dextroamphetamine  (ADDERALL) 15 MG tablet  Has the patient contacted their pharmacy? No (Agent: If no, request that the patient contact the pharmacy for the refill. If patient does not wish to contact the pharmacy document the reason why and proceed with request.) (Agent: If yes, when and what did the pharmacy advise?)  This is the patient's preferred pharmacy:  [id number] - [place], [place] - [address] [place] [address] Phone: [phone] Fax: [phone]  Is this the correct pharmacy for this prescription? Yes If no, delete pharmacy and type the correct one.   Has the prescription been filled recently? Yes  Is the patient out of the medication? No  Has the patient been seen for an appointment in the last year OR does the patient have an upcoming appointment? Yes  Can we respond through MyChart? Yes  Agent: Please be advised that Rx refills may take up to 3 business days. We ask that you follow-up with your pharmacy.

## 2024-04-27 NOTE — Telephone Encounter (Signed)
 Requested medication (s) are due for refill today: yes  Requested medication (s) are on the active medication list: yes  Last refill:  03/29/24  Future visit scheduled: yes  Notes to clinic:  Unable to refill per protocol, cannot delegate.      Requested Prescriptions  Pending Prescriptions Disp Refills   amphetamine -dextroamphetamine  (ADDERALL) 15 MG tablet 60 tablet 0    Sig: Take 1 tablet by mouth 2 (two) times daily with a meal.     Not Delegated - Psychiatry:  Stimulants/ADHD Failed - 04/27/2024  9:09 AM      Failed - This refill cannot be delegated      Failed - Urine Drug Screen completed in last 360 days      Passed - Last BP in normal range    BP Readings from Last 1 Encounters:  11/30/23 138/81         Passed - Last Heart Rate in normal range    Pulse Readings from Last 1 Encounters:  11/30/23 98         Passed - Valid encounter within last 6 months    Recent Outpatient Visits           4 months ago Annual physical exam   [facility] Gasper Nancyann BRAVO, MD       Future Appointments             In 7 months Fisher, Nancyann BRAVO, MD [facility], [place]

## 2024-04-28 MED ORDER — AMPHETAMINE-DEXTROAMPHETAMINE 15 MG PO TABS: 15.0000 mg | ORAL_TABLET | Freq: Two times a day (BID) | ORAL | 0 refills | Status: DC

## 2024-05-28 ENCOUNTER — Telehealth: Payer: Self-pay | Admitting: Family Medicine

## 2024-05-28 DIAGNOSIS — F988 Other specified behavioral and emotional disorders with onset usually occurring in childhood and adolescence: Secondary | ICD-10-CM

## 2024-05-28 NOTE — Telephone Encounter (Unsigned)
 Copied from CRM #[id number]. Topic: Clinical - Medication Refill >> May 28, 2024  9:34 AM Berwyn MATSU wrote: Medication: amphetamine -dextroamphetamine  (ADDERALL) 15 MG tablet; patient is requesting refill prior to Thursday 05/31/24.    Has the patient contacted their pharmacy? Yes (Agent: If no, request that the patient contact the pharmacy for the refill. If patient does not wish to contact the pharmacy document the reason why and proceed with request.) (Agent: If yes, when and what did the pharmacy advise?)  This is the patient's preferred pharmacy:  [id number] [place], [place] - [address] [place] [address] Phone: [phone] Fax: [phone]  Is this the correct pharmacy for this prescription? Yes If no, delete pharmacy and type the correct one.   Has the prescription been filled recently? Yes  Is the patient out of the medication? Yes  Has the patient been seen for an appointment in the last year OR does the patient have an upcoming appointment? Yes  Can we respond through MyChart? No  Agent: Please be advised that Rx refills may take up to 3 business days. We ask that you follow-up with your pharmacy.

## 2024-05-29 ENCOUNTER — Encounter: Payer: Self-pay | Admitting: Family Medicine

## 2024-05-30 MED ORDER — AMPHETAMINE-DEXTROAMPHETAMINE 15 MG PO TABS: 15.0000 mg | ORAL_TABLET | Freq: Two times a day (BID) | ORAL | 0 refills | Status: DC

## 2024-06-26 ENCOUNTER — Other Ambulatory Visit: Payer: Self-pay | Admitting: Family Medicine

## 2024-07-03 DIAGNOSIS — F988 Other specified behavioral and emotional disorders with onset usually occurring in childhood and adolescence: Secondary | ICD-10-CM

## 2024-07-03 MED ORDER — AMPHETAMINE-DEXTROAMPHETAMINE 15 MG PO TABS: 15.0000 mg | ORAL_TABLET | Freq: Two times a day (BID) | ORAL | 0 refills | Status: DC

## 2024-07-03 NOTE — Telephone Encounter (Signed)
 Copied from CRM [id number]. Topic: Clinical - Medication Refill >> Jul 03, 2024  3:17 PM Deaijah H wrote: Medication: amphetamine -dextroamphetamine  (ADDERALL) 15 MG tablet    Has the patient contacted their pharmacy? No (Agent: If no, request that the patient contact the pharmacy for the refill. If patient does not wish to contact the pharmacy document the reason why and proceed with request.) (Agent: If yes, when and what did the pharmacy advise?)  This is the patient's preferred pharmacy:  [id number] [place], [place] - [address] [place] [address] Phone: [phone] Fax: [phone]  Is this the correct pharmacy for this prescription? Yes If no, delete pharmacy and type the correct one.   Has the prescription been filled recently? Yes  Is the patient out of the medication? Yes  Has the patient been seen for an appointment in the last year OR does the patient have an upcoming appointment? Yes  Can we respond through MyChart? Yes  Agent: Please be advised that Rx refills may take up to 3 business days. We ask that you follow-up with your pharmacy.

## 2024-07-18 ENCOUNTER — Ambulatory Visit (INDEPENDENT_AMBULATORY_CARE_PROVIDER_SITE_OTHER): Admitting: Family Medicine

## 2024-07-18 ENCOUNTER — Encounter: Payer: Self-pay | Admitting: Family Medicine

## 2024-07-18 DIAGNOSIS — F988 Other specified behavioral and emotional disorders with onset usually occurring in childhood and adolescence: Secondary | ICD-10-CM | POA: Diagnosis not present

## 2024-07-18 MED ORDER — AMPHETAMINE-DEXTROAMPHETAMINE 15 MG PO TABS: 15.0000 mg | ORAL_TABLET | Freq: Two times a day (BID) | ORAL | 0 refills | Status: DC

## 2024-07-18 NOTE — Progress Notes (Signed)
" °  ° ° °  Established patient visit   Patient: Patrick Ford   DOB: [birth year]   34 y.o. Male  MRN: [id number] Visit Date: 07/18/2024  Today's healthcare provider: Nancyann Perry, MD   Chief Complaint  Patient presents with   Medication Refill    Med Refill    Subjective    Discussed the use of AI scribe software for clinical note transcription with the patient, who gave verbal consent to proceed.  History of Present Illness   Patrick Ford is a 34 year old male who presents for a follow-up on Adderall use and recent blood pressure and cholesterol concerns.  He is here for a follow-up regarding his Adderall use. He reports no difficulty focusing or staying on track with his projects. He last filled his prescription two weeks ago and has about two and a half to three weeks left of the medication.  He mentions a recent wellness check at work where his blood pressure was recorded at 135/86, which was higher than usual. He attributes this to possibly wearing thicker clothing during the check. No symptoms of heart racing, fluttering, or trouble sleeping.  He reports that his cholesterol levels have increased compared to last year. His total cholesterol is 249, with an HDL of 87 and an LDL of 131. He is unsure of the cause as he has made dietary changes, consuming a lot of red meat and eggs but has recently started incorporating chicken into his diet. He is concerned about the cholesterol levels despite these changes.       Medications: Show/hide medication list[1] Review of Systems     Objective    BP 116/81 (BP Location: Left Arm, Patient Position: Sitting, Cuff Size: Large)   Pulse (!) 102   Resp 16   Ht 6' 5 (1.956 m)   Wt 180 lb (81.6 kg)   SpO2 98%   BMI 21.34 kg/m   Physical Exam   General: Appearance:    Well developed, well nourished male in no acute distress  Eyes:    PERRL, conjunctiva/corneas clear, EOM's intact       Lungs:     Clear to auscultation  bilaterally, respirations unlabored  Heart:    Tachycardic. Normal rhythm. No murmurs, rubs, or gallops.    MS:   All extremities are intact.    Neurologic:   Awake, alert, oriented x 3. No apparent focal neurological defect.         Assessment & Plan        Attention deficit disorder without hyperactivity Well-managed with Adderall. Current dose effective. - Continue current dose of Adderall. - Ensure medication refills are available as needed.      Nancyann Perry, MD  [facility] [phone] (phone) [phone] (fax)  [facility]    [1]  Outpatient Medications Prior to Visit  Medication Sig   [DISCONTINUED] amphetamine -dextroamphetamine  (ADDERALL) 15 MG tablet Take 1 tablet by mouth 2 (two) times daily with a meal.   No facility-administered medications prior to visit.   "

## 2024-07-27 ENCOUNTER — Other Ambulatory Visit: Payer: Self-pay | Admitting: Family Medicine

## 2024-07-27 DIAGNOSIS — F988 Other specified behavioral and emotional disorders with onset usually occurring in childhood and adolescence: Secondary | ICD-10-CM

## 2024-07-27 NOTE — Telephone Encounter (Unsigned)
 Copied from CRM #[id number]. Topic: Clinical - Medication Refill >> Jul 27, 2024 11:20 AM Mercedes MATSU wrote: Medication: amphetamine -dextroamphetamine  (ADDERALL) 15 MG tablet  Has the patient contacted their pharmacy? No (Agent: If no, request that the patient contact the pharmacy for the refill. If patient does not wish to contact the pharmacy document the reason why and proceed with request.) (Agent: If yes, when and what did the pharmacy advise?)  This is the patient's preferred pharmacy:  [id number] - [place], [place] - [address] [place] [address] Phone: [phone] Fax: [phone]  Is this the correct pharmacy for this prescription? Yes If no, delete pharmacy and type the correct one.   Has the prescription been filled recently? Yes  Is the patient out of the medication? Yes  Has the patient been seen for an appointment in the last year OR does the patient have an upcoming appointment? Yes  Can we respond through MyChart? Yes  Agent: Please be advised that Rx refills may take up to 3 business days. We ask that you follow-up with your pharmacy.

## 2024-07-31 MED ORDER — AMPHETAMINE-DEXTROAMPHETAMINE 15 MG PO TABS: 15.0000 mg | ORAL_TABLET | Freq: Two times a day (BID) | ORAL | 0 refills | Status: AC

## 2024-12-05 ENCOUNTER — Encounter: Admitting: Family Medicine
